# Patient Record
Sex: Female | Born: 1970 | Race: White | Hispanic: No | Smoking: Current some day smoker
Health system: Southern US, Community
[De-identification: ages and names within clinical notes are randomized; demographics above are authoritative.]

## PROBLEM LIST (undated history)

## (undated) DIAGNOSIS — G43909 Migraine, unspecified, not intractable, without status migrainosus: Secondary | ICD-10-CM

## (undated) DIAGNOSIS — N6459 Other signs and symptoms in breast: Secondary | ICD-10-CM

## (undated) DIAGNOSIS — I509 Heart failure, unspecified: Secondary | ICD-10-CM

## (undated) DIAGNOSIS — I2510 Atherosclerotic heart disease of native coronary artery without angina pectoris: Secondary | ICD-10-CM

## (undated) DIAGNOSIS — I10 Essential (primary) hypertension: Secondary | ICD-10-CM

## (undated) DIAGNOSIS — E119 Type 2 diabetes mellitus without complications: Secondary | ICD-10-CM

## (undated) DIAGNOSIS — J45909 Unspecified asthma, uncomplicated: Secondary | ICD-10-CM

## (undated) HISTORY — PX: ABDOMINAL HYSTERECTOMY: SHX81

---

## 2011-12-08 DIAGNOSIS — F339 Major depressive disorder, recurrent, unspecified: Secondary | ICD-10-CM | POA: Insufficient documentation

## 2011-12-08 DIAGNOSIS — F4310 Post-traumatic stress disorder, unspecified: Secondary | ICD-10-CM | POA: Insufficient documentation

## 2012-05-09 ENCOUNTER — Emergency Department: Payer: Self-pay | Admitting: Emergency Medicine

## 2012-05-09 LAB — CBC
HCT: 41.1 % (ref 35.0–47.0)
MCH: 32.1 pg (ref 26.0–34.0)
MCHC: 33.4 g/dL (ref 32.0–36.0)
Platelet: 198 10*3/uL (ref 150–440)
RDW: 12.5 % (ref 11.5–14.5)
WBC: 8.8 10*3/uL (ref 3.6–11.0)

## 2012-05-09 LAB — BASIC METABOLIC PANEL
Chloride: 112 mmol/L — ABNORMAL HIGH (ref 98–107)
Co2: 23 mmol/L (ref 21–32)
Creatinine: 0.62 mg/dL (ref 0.60–1.30)
EGFR (African American): >60
Potassium: 4.1 mmol/L (ref 3.5–5.1)

## 2012-05-09 LAB — TROPONIN I: Troponin-I: <0.02 ng/mL

## 2012-05-09 LAB — CK TOTAL AND CKMB (NOT AT ARMC): CK, Total: 106 U/L (ref 21–215)

## 2012-07-24 ENCOUNTER — Ambulatory Visit: Payer: Self-pay | Admitting: Family Medicine

## 2012-07-31 ENCOUNTER — Ambulatory Visit: Payer: Self-pay | Admitting: Family Medicine

## 2012-09-15 ENCOUNTER — Ambulatory Visit: Payer: Self-pay | Admitting: Family Medicine

## 2012-09-15 LAB — URINALYSIS, COMPLETE
Bilirubin,UR: NEGATIVE
Glucose,UR: NEGATIVE mg/dL (ref 0–75)
Leukocyte Esterase: NEGATIVE
Nitrite: NEGATIVE

## 2012-09-16 LAB — URINE CULTURE

## 2012-12-07 ENCOUNTER — Ambulatory Visit: Payer: Self-pay | Admitting: Emergency Medicine

## 2012-12-24 DIAGNOSIS — G479 Sleep disorder, unspecified: Secondary | ICD-10-CM | POA: Insufficient documentation

## 2012-12-24 DIAGNOSIS — R110 Nausea: Secondary | ICD-10-CM | POA: Insufficient documentation

## 2013-01-03 ENCOUNTER — Emergency Department: Payer: Self-pay | Admitting: Internal Medicine

## 2013-01-03 LAB — URINALYSIS, COMPLETE
Nitrite: NEGATIVE
Ph: 7 (ref 4.5–8.0)
Protein: NEGATIVE
RBC,UR: 1 /HPF (ref 0–5)
Specific Gravity: 1.006 (ref 1.003–1.030)
Squamous Epithelial: 4
WBC UR: 3 /HPF (ref 0–5)

## 2013-01-03 LAB — CBC
HGB: 12.9 g/dL (ref 12.0–16.0)
MCH: 31.8 pg (ref 26.0–34.0)
MCHC: 33.8 g/dL (ref 32.0–36.0)
Platelet: 219 10*3/uL (ref 150–440)
RBC: 4.06 10*6/uL (ref 3.80–5.20)
WBC: 8.5 10*3/uL (ref 3.6–11.0)

## 2013-01-03 LAB — TROPONIN I
Troponin-I: <0.02 ng/mL
Troponin-I: <0.02 ng/mL

## 2013-01-03 LAB — COMPREHENSIVE METABOLIC PANEL
Alkaline Phosphatase: 48 U/L — ABNORMAL LOW (ref 50–136)
Bilirubin,Total: 0.3 mg/dL (ref 0.2–1.0)
Calcium, Total: 8.4 mg/dL — ABNORMAL LOW (ref 8.5–10.1)
Creatinine: 0.6 mg/dL (ref 0.60–1.30)
EGFR (African American): >60
EGFR (Non-African Amer.): >60
Glucose: 89 mg/dL (ref 65–99)
Potassium: 4 mmol/L (ref 3.5–5.1)
SGPT (ALT): 32 U/L (ref 12–78)
Sodium: 141 mmol/L (ref 136–145)

## 2013-04-29 ENCOUNTER — Ambulatory Visit: Payer: Self-pay | Admitting: Physician Assistant

## 2013-05-05 LAB — RAPID INFLUENZA A&B ANTIGENS (ARMC ONLY)

## 2013-05-16 DIAGNOSIS — F17200 Nicotine dependence, unspecified, uncomplicated: Secondary | ICD-10-CM | POA: Insufficient documentation

## 2013-06-16 ENCOUNTER — Ambulatory Visit: Payer: Self-pay | Admitting: Internal Medicine

## 2013-10-26 ENCOUNTER — Emergency Department: Payer: Self-pay | Admitting: Emergency Medicine

## 2013-10-26 LAB — CBC WITH DIFFERENTIAL/PLATELET
BASOS ABS: 0 10*3/uL (ref 0.0–0.1)
Basophil %: 0.6 %
Eosinophil #: 0.3 10*3/uL (ref 0.0–0.7)
Eosinophil %: 4 %
HCT: 41.8 % (ref 35.0–47.0)
HGB: 13.9 g/dL (ref 12.0–16.0)
LYMPHS PCT: 34.7 %
Lymphocyte #: 2.5 10*3/uL (ref 1.0–3.6)
MCH: 32.3 pg (ref 26.0–34.0)
MCHC: 33.2 g/dL (ref 32.0–36.0)
MCV: 97 fL (ref 80–100)
MONO ABS: 0.3 x10 3/mm (ref 0.2–0.9)
Monocyte %: 4.6 %
Neutrophil #: 4.1 10*3/uL (ref 1.4–6.5)
Neutrophil %: 56.1 %
Platelet: 186 10*3/uL (ref 150–440)
RBC: 4.31 10*6/uL (ref 3.80–5.20)
RDW: 12.4 % (ref 11.5–14.5)
WBC: 7.2 10*3/uL (ref 3.6–11.0)

## 2013-10-26 LAB — COMPREHENSIVE METABOLIC PANEL
ANION GAP: 7 (ref 7–16)
Albumin: 4.1 g/dL (ref 3.4–5.0)
Alkaline Phosphatase: 42 U/L — ABNORMAL LOW
BUN: 18 mg/dL (ref 7–18)
Bilirubin,Total: 0.4 mg/dL (ref 0.2–1.0)
CHLORIDE: 108 mmol/L — AB (ref 98–107)
Calcium, Total: 9.1 mg/dL (ref 8.5–10.1)
Co2: 25 mmol/L (ref 21–32)
Creatinine: 0.89 mg/dL (ref 0.60–1.30)
EGFR (Non-African Amer.): >60
GLUCOSE: 100 mg/dL — AB (ref 65–99)
OSMOLALITY: 281 (ref 275–301)
POTASSIUM: 3.9 mmol/L (ref 3.5–5.1)
SGOT(AST): 15 U/L (ref 15–37)
SGPT (ALT): 18 U/L (ref 12–78)
Sodium: 140 mmol/L (ref 136–145)
Total Protein: 7.4 g/dL (ref 6.4–8.2)

## 2013-10-26 LAB — URINALYSIS, COMPLETE
BLOOD: NEGATIVE
GLUCOSE, UR: NEGATIVE mg/dL (ref 0–75)
Nitrite: NEGATIVE
PH: 5 (ref 4.5–8.0)
SPECIFIC GRAVITY: 1.043 (ref 1.003–1.030)

## 2013-12-30 ENCOUNTER — Emergency Department: Payer: Self-pay | Admitting: Emergency Medicine

## 2013-12-30 LAB — BASIC METABOLIC PANEL
Anion Gap: 4 — ABNORMAL LOW (ref 7–16)
BUN: 17 mg/dL (ref 7–18)
CO2: 28 mmol/L (ref 21–32)
CREATININE: 0.75 mg/dL (ref 0.60–1.30)
Calcium, Total: 8.7 mg/dL (ref 8.5–10.1)
Chloride: 109 mmol/L — ABNORMAL HIGH (ref 98–107)
EGFR (African American): >60
GLUCOSE: 123 mg/dL — AB (ref 65–99)
OSMOLALITY: 284 (ref 275–301)
Potassium: 3.7 mmol/L (ref 3.5–5.1)
Sodium: 141 mmol/L (ref 136–145)

## 2013-12-30 LAB — CBC
HCT: 41.4 % (ref 35.0–47.0)
HGB: 13.9 g/dL (ref 12.0–16.0)
MCH: 32.5 pg (ref 26.0–34.0)
MCHC: 33.5 g/dL (ref 32.0–36.0)
MCV: 97 fL (ref 80–100)
Platelet: 157 10*3/uL (ref 150–440)
RBC: 4.26 10*6/uL (ref 3.80–5.20)
RDW: 12.9 % (ref 11.5–14.5)
WBC: 6.4 10*3/uL (ref 3.6–11.0)

## 2013-12-30 LAB — TROPONIN I: Troponin-I: <0.02 ng/mL

## 2014-01-16 ENCOUNTER — Emergency Department: Payer: Self-pay | Admitting: Emergency Medicine

## 2014-01-16 LAB — DRUG SCREEN, URINE
Amphetamines, Ur Screen: NEGATIVE (ref ?–1000)
BARBITURATES, UR SCREEN: NEGATIVE (ref ?–200)
BENZODIAZEPINE, UR SCRN: NEGATIVE (ref ?–200)
CANNABINOID 50 NG, UR ~~LOC~~: NEGATIVE (ref ?–50)
COCAINE METABOLITE, UR ~~LOC~~: NEGATIVE (ref ?–300)
MDMA (Ecstasy)Ur Screen: NEGATIVE (ref ?–500)
Methadone, Ur Screen: NEGATIVE (ref ?–300)
Opiate, Ur Screen: NEGATIVE (ref ?–300)
Phencyclidine (PCP) Ur S: NEGATIVE (ref ?–25)
TRICYCLIC, UR SCREEN: NEGATIVE (ref ?–1000)

## 2014-01-16 LAB — CBC
HCT: 42.5 % (ref 35.0–47.0)
HGB: 13.7 g/dL (ref 12.0–16.0)
MCH: 31.8 pg (ref 26.0–34.0)
MCHC: 32.3 g/dL (ref 32.0–36.0)
MCV: 98 fL (ref 80–100)
Platelet: 187 10*3/uL (ref 150–440)
RBC: 4.32 10*6/uL (ref 3.80–5.20)
RDW: 13.2 % (ref 11.5–14.5)
WBC: 8.8 10*3/uL (ref 3.6–11.0)

## 2014-01-16 LAB — URINALYSIS, COMPLETE
BACTERIA: NONE SEEN
BLOOD: NEGATIVE
Bilirubin,UR: NEGATIVE
Glucose,UR: NEGATIVE mg/dL (ref 0–75)
KETONE: NEGATIVE
Leukocyte Esterase: NEGATIVE
NITRITE: NEGATIVE
PROTEIN: NEGATIVE
Ph: 5 (ref 4.5–8.0)
SPECIFIC GRAVITY: 1.028 (ref 1.003–1.030)
WBC UR: <1 /HPF (ref 0–5)

## 2014-01-16 LAB — ACETAMINOPHEN LEVEL

## 2014-01-16 LAB — COMPREHENSIVE METABOLIC PANEL
ALBUMIN: 3.8 g/dL (ref 3.4–5.0)
ALK PHOS: 39 U/L — AB
ALT: 29 U/L
AST: 24 U/L (ref 15–37)
Anion Gap: 8 (ref 7–16)
BUN: 15 mg/dL (ref 7–18)
Bilirubin,Total: 0.4 mg/dL (ref 0.2–1.0)
CHLORIDE: 108 mmol/L — AB (ref 98–107)
CO2: 23 mmol/L (ref 21–32)
Calcium, Total: 8.4 mg/dL — ABNORMAL LOW (ref 8.5–10.1)
Creatinine: 0.6 mg/dL (ref 0.60–1.30)
EGFR (Non-African Amer.): >60
GLUCOSE: 103 mg/dL — AB (ref 65–99)
Osmolality: 279 (ref 275–301)
POTASSIUM: 3.6 mmol/L (ref 3.5–5.1)
Sodium: 139 mmol/L (ref 136–145)
TOTAL PROTEIN: 7.4 g/dL (ref 6.4–8.2)

## 2014-01-16 LAB — ETHANOL

## 2014-01-16 LAB — SALICYLATE LEVEL: Salicylates, Serum: 3.3 mg/dL — ABNORMAL HIGH

## 2014-01-17 ENCOUNTER — Emergency Department: Payer: Self-pay | Admitting: Emergency Medicine

## 2014-02-10 ENCOUNTER — Ambulatory Visit: Payer: Self-pay | Admitting: Internal Medicine

## 2014-02-23 ENCOUNTER — Ambulatory Visit: Payer: Self-pay | Admitting: Internal Medicine

## 2014-03-22 ENCOUNTER — Emergency Department: Payer: Self-pay | Admitting: Emergency Medicine

## 2014-03-22 LAB — BASIC METABOLIC PANEL
Anion Gap: 6 — ABNORMAL LOW (ref 7–16)
BUN: 22 mg/dL — ABNORMAL HIGH (ref 7–18)
Calcium, Total: 8.5 mg/dL (ref 8.5–10.1)
Chloride: 107 mmol/L (ref 98–107)
Co2: 24 mmol/L (ref 21–32)
Creatinine: 0.7 mg/dL (ref 0.60–1.30)
EGFR (African American): >60
EGFR (Non-African Amer.): >60
Glucose: 102 mg/dL — ABNORMAL HIGH (ref 65–99)
Osmolality: 277 (ref 275–301)
Potassium: 3.9 mmol/L (ref 3.5–5.1)
Sodium: 137 mmol/L (ref 136–145)

## 2014-03-22 LAB — CBC
HCT: 40.9 % (ref 35.0–47.0)
HGB: 13.6 g/dL (ref 12.0–16.0)
MCH: 32.9 pg (ref 26.0–34.0)
MCHC: 33.2 g/dL (ref 32.0–36.0)
MCV: 99 fL (ref 80–100)
Platelet: 182 10*3/uL (ref 150–440)
RBC: 4.13 10*6/uL (ref 3.80–5.20)
RDW: 13 % (ref 11.5–14.5)
WBC: 8.9 10*3/uL (ref 3.6–11.0)

## 2014-03-22 LAB — TROPONIN I
Troponin-I: <0.02 ng/mL
Troponin-I: <0.02 ng/mL

## 2014-03-25 ENCOUNTER — Ambulatory Visit: Payer: Self-pay | Admitting: Internal Medicine

## 2014-06-18 ENCOUNTER — Emergency Department: Payer: Self-pay | Admitting: Emergency Medicine

## 2014-07-09 ENCOUNTER — Emergency Department: Payer: Self-pay | Admitting: Internal Medicine

## 2014-08-03 ENCOUNTER — Emergency Department
Admit: 2014-08-03 | Disposition: A | Discharge status: Home / Self Care | Payer: Self-pay | Admitting: Physician Assistant

## 2014-10-12 ENCOUNTER — Encounter: Payer: Self-pay | Admitting: Emergency Medicine

## 2014-10-12 ENCOUNTER — Other Ambulatory Visit: Payer: Self-pay

## 2014-10-12 ENCOUNTER — Emergency Department
Admission: EM | Admit: 2014-10-12 | Discharge: 2014-10-12 | Disposition: A | Discharge status: Home / Self Care | Payer: Medicaid Other | Attending: Emergency Medicine | Admitting: Emergency Medicine

## 2014-10-12 DIAGNOSIS — F430 Acute stress reaction: Secondary | ICD-10-CM | POA: Diagnosis not present

## 2014-10-12 DIAGNOSIS — Z720 Tobacco use: Secondary | ICD-10-CM | POA: Diagnosis not present

## 2014-10-12 DIAGNOSIS — R079 Chest pain, unspecified: Secondary | ICD-10-CM | POA: Insufficient documentation

## 2014-10-12 DIAGNOSIS — R110 Nausea: Secondary | ICD-10-CM

## 2014-10-12 DIAGNOSIS — F419 Anxiety disorder, unspecified: Secondary | ICD-10-CM | POA: Insufficient documentation

## 2014-10-12 DIAGNOSIS — R519 Headache, unspecified: Secondary | ICD-10-CM

## 2014-10-12 DIAGNOSIS — R51 Headache: Secondary | ICD-10-CM | POA: Diagnosis present

## 2014-10-12 HISTORY — DX: Unspecified asthma, uncomplicated: J45.909

## 2014-10-12 HISTORY — DX: Heart failure, unspecified: I50.9

## 2014-10-12 HISTORY — DX: Migraine, unspecified, not intractable, without status migrainosus: G43.909

## 2014-10-12 LAB — COMPREHENSIVE METABOLIC PANEL
ALT: 22 U/L (ref 14–54)
ANION GAP: 4 — AB (ref 5–15)
AST: 18 U/L (ref 15–41)
Albumin: 3.8 g/dL (ref 3.5–5.0)
Alkaline Phosphatase: 42 U/L (ref 38–126)
BUN: 15 mg/dL (ref 6–20)
CALCIUM: 8.5 mg/dL — AB (ref 8.9–10.3)
CO2: 24 mmol/L (ref 22–32)
CREATININE: 0.59 mg/dL (ref 0.44–1.00)
Chloride: 112 mmol/L — ABNORMAL HIGH (ref 101–111)
GFR calc non Af Amer: >60 mL/min (ref >60)
GLUCOSE: 109 mg/dL — AB (ref 65–99)
POTASSIUM: 3.8 mmol/L (ref 3.5–5.1)
Sodium: 140 mmol/L (ref 135–145)
Total Bilirubin: 0.6 mg/dL (ref 0.3–1.2)
Total Protein: 6.7 g/dL (ref 6.5–8.1)

## 2014-10-12 LAB — LIPASE, BLOOD: LIPASE: 27 U/L (ref 22–51)

## 2014-10-12 LAB — CBC WITH DIFFERENTIAL/PLATELET
BASOS ABS: 0 10*3/uL (ref 0–0.1)
Basophils Relative: 1 %
Eosinophils Absolute: 0.3 10*3/uL (ref 0–0.7)
Eosinophils Relative: 4 %
HEMATOCRIT: 41.1 % (ref 35.0–47.0)
Hemoglobin: 13.9 g/dL (ref 12.0–16.0)
LYMPHS ABS: 2.2 10*3/uL (ref 1.0–3.6)
Lymphocytes Relative: 28 %
MCH: 32.7 pg (ref 26.0–34.0)
MCHC: 33.9 g/dL (ref 32.0–36.0)
MCV: 96.5 fL (ref 80.0–100.0)
MONO ABS: 0.4 10*3/uL (ref 0.2–0.9)
Monocytes Relative: 5 %
Neutro Abs: 4.9 10*3/uL (ref 1.4–6.5)
Neutrophils Relative %: 62 %
PLATELETS: 169 10*3/uL (ref 150–440)
RBC: 4.26 MIL/uL (ref 3.80–5.20)
RDW: 12.7 % (ref 11.5–14.5)
WBC: 7.8 10*3/uL (ref 3.6–11.0)

## 2014-10-12 LAB — TROPONIN I: Troponin I: <0.03 ng/mL (ref <0.031)

## 2014-10-12 MED ORDER — ONDANSETRON HCL 4 MG/2ML IJ SOLN
INTRAMUSCULAR | Status: AC
  Filled 2014-10-12: qty 2

## 2014-10-12 MED ORDER — METOCLOPRAMIDE HCL 5 MG/ML IJ SOLN
10.0000 mg | Freq: Once | INTRAMUSCULAR | Status: AC
  Administered 2014-10-12: 10 mg via INTRAVENOUS

## 2014-10-12 MED ORDER — KETOROLAC TROMETHAMINE 30 MG/ML IJ SOLN
INTRAMUSCULAR | Status: AC
  Filled 2014-10-12: qty 1

## 2014-10-12 MED ORDER — KETOROLAC TROMETHAMINE 30 MG/ML IJ SOLN
30.0000 mg | Freq: Once | INTRAMUSCULAR | Status: AC
  Administered 2014-10-12: 30 mg via INTRAVENOUS

## 2014-10-12 MED ORDER — METOCLOPRAMIDE HCL 5 MG/ML IJ SOLN
INTRAMUSCULAR | Status: AC
  Filled 2014-10-12: qty 2

## 2014-10-12 MED ORDER — ONDANSETRON HCL 4 MG/2ML IJ SOLN
4.0000 mg | Freq: Once | INTRAMUSCULAR | Status: AC
  Administered 2014-10-12: 4 mg via INTRAVENOUS

## 2014-10-12 NOTE — ED Notes (Signed)
Patient to ED with c.o nausea and vomiting that started this AM. Patient vomiting upon arrival to ER. Patient states she is also experiencing chest pain that started last week. Patient states a headache has also been present during the past week.

## 2014-10-12 NOTE — ED Provider Notes (Signed)
[facility] Emergency Department Provider Note     Time seen: ----------------------------------------- 3:26 PM on 10/12/2014 -----------------------------------------    I have reviewed the triage vital signs and the nursing notes.   HISTORY  Chief Complaint Nausea and Emesis    HPI JOSHUA ZERINGUE is a 44 y.o. female who presents ER for nausea, headache, chest pain. Patient states people are breaking into her house and the landlord knows this. His been causing her stress and her making her symptoms worse. This been going on for the last week or so, she seen by Dr. Lennette Bihari cardiology for her heart failure. Denies any fluid retention or shortness of breath.   Past Medical History  Diagnosis Date  . CHF (congestive heart failure)   . Asthma   . Migraines     There are no active problems to display for this patient.   Past Surgical History  Procedure Laterality Date  . Abdominal hysterectomy      Allergies Latex and Penicillins  Social History History  Substance Use Topics  . Smoking status: Current Every Day Smoker  . Smokeless tobacco: Not on file  . Alcohol Use: No    Review of Systems Constitutional: Negative for fever. Eyes: Negative for visual changes. ENT: Negative for sore throat. Cardiovascular: Positive for chest pain Respiratory: Negative for shortness of breath. Gastrointestinal: Negative for abdominal pain, vomiting and diarrhea. Genitourinary: Negative for dysuria. Musculoskeletal: Negative for back pain. Skin: Negative for rash. Neurological: Positive for headache, focal weakness or numbness. Psychiatric: Positive for anxiety and stress  10-point ROS otherwise negative.  ____________________________________________   PHYSICAL EXAM:  VITAL SIGNS: ED Triage Vitals  Enc Vitals Group     BP 10/12/14 1312 125/92 mmHg     Pulse Rate 10/12/14 1312 62     Resp 10/12/14 1312 18     Temp 10/12/14 1312 98.7 F (37.1  C)     Temp Source 10/12/14 1312 Oral     SpO2 10/12/14 1312 94 %     Weight 10/12/14 1312 224 lb (101.606 kg)     Height 10/12/14 1312  (1.753 m)     Head Cir --      Peak Flow --      Pain Score 10/12/14 1313 10     Pain Loc --      Pain Edu? --      Excl. in GC? --     Constitutional: Alert and oriented. Well appearing and in no distress. Eyes: Conjunctivae are normal. PERRL. Normal extraocular movements. ENT   Head: Normocephalic and atraumatic.   Nose: No congestion/rhinnorhea.   Mouth/Throat: Mucous membranes are moist.   Neck: No stridor. Hematological/Lymphatic/Immunilogical: No cervical lymphadenopathy. Cardiovascular: Normal rate, regular rhythm. Normal and symmetric distal pulses are present in all extremities. No murmurs, rubs, or gallops. Respiratory: Normal respiratory effort without tachypnea nor retractions. Breath sounds are clear and equal bilaterally. No wheezes/rales/rhonchi. Gastrointestinal: Soft and nontender. No distention. No abdominal bruits. There is no CVA tenderness. Musculoskeletal: Nontender with normal range of motion in all extremities. No joint effusions.  No lower extremity tenderness nor edema. Neurologic:  Normal speech and language. No gross focal neurologic deficits are appreciated. Speech is normal. No gait instability. Skin:  Skin is warm, dry and intact. No rash noted. Psychiatric: Bizarre behavior and affect. ____________________________________________  EKG: Interpreted by me. Sinus bradycardia with a rate of 58, RSR pattern, normal axis, normal intervals. No evidence of hypertrophy or acute infarction.  ____________________________________________  ED COURSE:  Pertinent labs & imaging results that were available during my care of the patient were reviewed by me and considered in my medical decision making (see chart for details). Patient receive headache and nausea medications, this is likely psychosocial in  etiology. ____________________________________________    LABS (pertinent positives/negatives)  Labs Reviewed  COMPREHENSIVE METABOLIC PANEL - Abnormal; Notable for the following:    Chloride 112 (*)    Glucose, Bld 109 (*)    Calcium 8.5 (*)    Anion gap 4 (*)    All other components within normal limits  CBC WITH DIFFERENTIAL/PLATELET  LIPASE, BLOOD  TROPONIN I    RADIOLOGY  None  ____________________________________________  FINAL ASSESSMENT AND PLAN  Headache, nausea, chest pain     Plan: Patient is in no acute distress, this seems to be related to the stress that she is under currently. We'll discharge with close follow-up with her doctor. She tolerating by mouth well now.   Emily Filbert, MD   Emily Filbert, MD 10/12/14 [phone]

## 2014-10-12 NOTE — ED Notes (Signed)
Patient given sandwich tray and ice water.

## 2014-10-12 NOTE — Discharge Instructions (Signed)

## 2014-11-02 ENCOUNTER — Ambulatory Visit
Admission: EM | Admit: 2014-11-02 | Discharge: 2014-11-02 | Disposition: A | Discharge status: Home / Self Care | Payer: Medicaid Other | Attending: Family Medicine | Admitting: Family Medicine

## 2014-11-02 DIAGNOSIS — H109 Unspecified conjunctivitis: Secondary | ICD-10-CM

## 2014-11-02 MED ORDER — DIPHENHYDRAMINE HCL 12.5 MG/5ML PO ELIX
25.0000 mg | ORAL_SOLUTION | Freq: Every day | ORAL | Status: AC | PRN
  Administered 2014-11-02: 25 mg via ORAL

## 2014-11-02 MED ORDER — MOXIFLOXACIN HCL 0.5 % OP SOLN: 1.0000 [drp] | Freq: Three times a day (TID) | OPHTHALMIC | Status: DC

## 2014-11-02 NOTE — ED Notes (Signed)
Patient complains of right eye pain. States that pain started last night. Patient eye is crusted over and swollen over. She states that she cannot see out of eye at all anymore. Patient states that she cannot open her eye. She states that she believes that this is an allergic reaction to the cats in her neighborhood.

## 2014-11-02 NOTE — ED Provider Notes (Signed)
CSN: [id number]     Arrival date & time 11/02/14  1125 History   First MD Initiated Contact with Patient 11/02/14 1140     Chief Complaint  Patient presents with  . Eye Pain   (Consider location/radiation/quality/duration/timing/severity/associated sxs/prior Treatment) HPI Comments: 44 yo female with a h/o right eye drainage, pain and crusting of the eyelids since last night. Did not rinse the eye or apply anything wet compresses. Eyelids currently crusted shut with drainage. Denies any injuries or trauma. States she thinks this is an allergic reaction to the cats in her neighborhood. Denies any difficulty breathing or swallowing, or facial swelling.  Patient is a 44 y.o. female presenting with conjunctivitis. The history is provided by the patient.  Conjunctivitis This is a new problem. The current episode started 12 to 24 hours ago. The problem occurs constantly. The problem has not changed since onset.   Past Medical History  Diagnosis Date  . CHF (congestive heart failure)   . Asthma   . Migraines    Past Surgical History  Procedure Laterality Date  . Abdominal hysterectomy     No family history on file. History  Substance Use Topics  . Smoking status: Current Every Day Smoker -- 0.50 packs/day    Types: Cigarettes  . Smokeless tobacco: Not on file  . Alcohol Use: No   OB History    No data available     Review of Systems  Allergies  Latex and Penicillins  Home Medications   Prior to Admission medications   Medication Sig Start Date End Date Taking? Authorizing Provider  moxifloxacin (VIGAMOX) 0.5 % ophthalmic solution Place 1 drop into the right eye 3 (three) times daily. For 5 days 11/02/14   Payton Mccallum, MD   BP 116/73 mmHg  Pulse 57  Temp(Src) 97.9 F (36.6 C) (Oral)  Resp 16  Ht  (1.753 m)  SpO2 99% Physical Exam  Constitutional: She appears well-developed and well-nourished. No distress.  HENT:  Head: Normocephalic and atraumatic.  Right Ear:  External ear normal.  Left Ear: External ear normal.  Nose: Nose normal.  Mouth/Throat: Oropharynx is clear and moist.  Eyes: EOM are normal. Pupils are equal, round, and reactive to light. Right eye exhibits discharge and exudate. Left eye exhibits no discharge. Right conjunctiva is injected. Left conjunctiva is not injected. No scleral icterus.  Right eyelids crusted shut with yellow drainage; this was cleaned/removed with moist gauze and eyelids opened; no foreign bodies visualized; conjunctiva injected; PERLL; EOMI  Neck: Neck supple. No tracheal deviation present.  Cardiovascular: Normal rate, regular rhythm and normal heart sounds.   Pulmonary/Chest: Effort normal and breath sounds normal. No respiratory distress. She has no wheezes. She has no rales.  Lymphadenopathy:    She has no cervical adenopathy.  Neurological: She is alert.  Skin: No rash noted. She is not diaphoretic.  Nursing note and vitals reviewed.   ED Course  Procedures (including critical care time) Labs Review Labs Reviewed - No data to display  Imaging Review No results found.   MDM   1. Conjunctivitis of right eye    New Prescriptions   MOXIFLOXACIN (VIGAMOX) 0.5 % OPHTHALMIC SOLUTION    Place 1 drop into the right eye 3 (three) times daily. For 5 days  Plan: 1. Test/x-ray results and diagnosis reviewed with patient 2. rx as per orders; risks, benefits, potential side effects reviewed with patient 3. Recommend supportive treatment with cool, moist compresses to affected eye 4. F/u prn  if symptoms worsen or don't improve    Payton Mccallumrlando Xzavian Semmel, MD 11/02/14 1229

## 2015-02-03 ENCOUNTER — Encounter: Payer: Self-pay | Admitting: Emergency Medicine

## 2015-02-03 ENCOUNTER — Emergency Department
Admission: EM | Admit: 2015-02-03 | Discharge: 2015-02-03 | Disposition: A | Discharge status: Home / Self Care | Payer: Medicaid Other | Attending: Emergency Medicine | Admitting: Emergency Medicine

## 2015-02-03 ENCOUNTER — Emergency Department: Payer: Medicaid Other

## 2015-02-03 DIAGNOSIS — M7750 Other enthesopathy of unspecified foot: Secondary | ICD-10-CM

## 2015-02-03 DIAGNOSIS — E119 Type 2 diabetes mellitus without complications: Secondary | ICD-10-CM | POA: Diagnosis not present

## 2015-02-03 DIAGNOSIS — M79671 Pain in right foot: Secondary | ICD-10-CM

## 2015-02-03 DIAGNOSIS — Z720 Tobacco use: Secondary | ICD-10-CM | POA: Insufficient documentation

## 2015-02-03 DIAGNOSIS — Z880 Allergy status to penicillin: Secondary | ICD-10-CM | POA: Insufficient documentation

## 2015-02-03 DIAGNOSIS — R260 Ataxic gait: Secondary | ICD-10-CM | POA: Diagnosis not present

## 2015-02-03 DIAGNOSIS — M779 Enthesopathy, unspecified: Secondary | ICD-10-CM | POA: Insufficient documentation

## 2015-02-03 DIAGNOSIS — Z91040 Latex allergy status: Secondary | ICD-10-CM | POA: Insufficient documentation

## 2015-02-03 DIAGNOSIS — I10 Essential (primary) hypertension: Secondary | ICD-10-CM | POA: Insufficient documentation

## 2015-02-03 DIAGNOSIS — Z792 Long term (current) use of antibiotics: Secondary | ICD-10-CM | POA: Diagnosis not present

## 2015-02-03 HISTORY — DX: Essential (primary) hypertension: I10

## 2015-02-03 HISTORY — DX: Type 2 diabetes mellitus without complications: E11.9

## 2015-02-03 MED ORDER — TRAMADOL HCL 50 MG PO TABS
ORAL_TABLET | ORAL | Status: DC
Start: 2015-02-03 — End: 2015-02-03
  Filled 2015-02-03: qty 1

## 2015-02-03 MED ORDER — TRAMADOL HCL 50 MG PO TABS: 50.0000 mg | ORAL_TABLET | Freq: Four times a day (QID) | ORAL | Status: DC | PRN

## 2015-02-03 MED ORDER — TRAMADOL HCL 50 MG PO TABS
50.0000 mg | ORAL_TABLET | Freq: Once | ORAL | Status: AC
  Administered 2015-02-03: 50 mg via ORAL

## 2015-02-03 NOTE — Discharge Instructions (Signed)
Cryotherapy °Cryotherapy means treatment with cold. Ice or gel packs can be used to reduce both pain and swelling. Ice is the most helpful within the first 24 to 48 hours after an injury or flare-up from overusing a muscle or joint. Sprains, strains, spasms, burning pain, shooting pain, and aches can all be eased with ice. Ice can also be used when recovering from surgery. Ice is effective, has very few side effects, and is safe for most people to use. °PRECAUTIONS  °Ice is not a safe treatment option for people with: °· Raynaud phenomenon. This is a condition affecting small blood vessels in the extremities. Exposure to cold may cause your problems to return. °· Cold hypersensitivity. There are many forms of cold hypersensitivity, including: °¨ Cold urticaria. Red, itchy hives appear on the skin when the tissues begin to warm after being iced. °¨ Cold erythema. This is a red, itchy rash caused by exposure to cold. °¨ Cold hemoglobinuria. Red blood cells break down when the tissues begin to warm after being iced. The hemoglobin that carry oxygen are passed into the urine because they cannot combine with blood proteins fast enough. °· Numbness or altered sensitivity in the area being iced. °If you have any of the following conditions, do not use ice until you have discussed cryotherapy with your caregiver: °· Heart conditions, such as arrhythmia, angina, or chronic heart disease. °· High blood pressure. °· Healing wounds or open skin in the area being iced. °· Current infections. °· Rheumatoid arthritis. °· Poor circulation. °· Diabetes. °Ice slows the blood flow in the region it is applied. This is beneficial when trying to stop inflamed tissues from spreading irritating chemicals to surrounding tissues. However, if you expose your skin to cold temperatures for too long or without the proper protection, you can damage your skin or nerves. Watch for signs of skin damage due to cold. °HOME CARE INSTRUCTIONS °Follow  these tips to use ice and cold packs safely. °· Place a dry or damp towel between the ice and skin. A damp towel will cool the skin more quickly, so you may need to shorten the time that the ice is used. °· For a more rapid response, add gentle compression to the ice. °· Ice for no more than 10 to 20 minutes at a time. The bonier the area you are icing, the less time it will take to get the benefits of ice. °· Check your skin after 5 minutes to make sure there are no signs of a poor response to cold or skin damage. °· Rest 20 minutes or more between uses. °· Once your skin is numb, you can end your treatment. You can test numbness by very lightly touching your skin. The touch should be so light that you do not see the skin dimple from the pressure of your fingertip. When using ice, most people will feel these normal sensations in this order: cold, burning, aching, and numbness. °· Do not use ice on someone who cannot communicate their responses to pain, such as small children or people with dementia. °HOW TO MAKE AN ICE PACK °Ice packs are the most common way to use ice therapy. Other methods include ice massage, ice baths, and cryosprays. Muscle creams that cause a cold, tingly feeling do not offer the same benefits that ice offers and should not be used as a substitute unless recommended by your caregiver. °To make an ice pack, do one of the following: °· Place crushed ice or a   bag of frozen vegetables in a sealable plastic bag. Squeeze out the excess air. Place this bag inside another plastic bag. Slide the bag into a pillowcase or place a damp towel between your skin and the bag. °· Mix 3 parts water with 1 part rubbing alcohol. Freeze the mixture in a sealable plastic bag. When you remove the mixture from the freezer, it will be slushy. Squeeze out the excess air. Place this bag inside another plastic bag. Slide the bag into a pillowcase or place a damp towel between your skin and the bag. °SEEK MEDICAL CARE  IF: °· You develop white spots on your skin. This may give the skin a blotchy (mottled) appearance. °· Your skin turns blue or pale. °· Your skin becomes waxy or hard. °· Your swelling gets worse. °MAKE SURE YOU:  °· Understand these instructions. °· Will watch your condition. °· Will get help right away if you are not doing well or get worse. °  °This information is not intended to replace advice given to you by your health care provider. Make sure you discuss any questions you have with your health care provider. °  °Document Released: 12/06/2010 Document Revised: 05/02/2014 Document Reviewed: 12/06/2010 °Elsevier Interactive Patient Education ©2016 [employer] ° °Musculoskeletal Pain °Musculoskeletal pain is muscle and boney aches and pains. These pains can occur in any part of the body. Your caregiver may treat you without knowing the cause of the pain. They may treat you if blood or urine tests, X-rays, and other tests were normal.  °CAUSES °There is often not a definite cause or reason for these pains. These pains may be caused by a type of germ (virus). The discomfort may also come from overuse. Overuse includes working out too hard when your body is not fit. Boney aches also come from weather changes. Bone is sensitive to atmospheric pressure changes. °HOME CARE INSTRUCTIONS  °· Ask when your test results will be ready. Make sure you get your test results. °· Only take over-the-counter or prescription medicines for pain, discomfort, or fever as directed by your caregiver. If you were given medications for your condition, do not drive, operate machinery or power tools, or sign legal documents for 24 hours. Do not drink alcohol. Do not take sleeping pills or other medications that may interfere with treatment. °· Continue all activities unless the activities cause more pain. When the pain lessens, slowly resume normal activities. Gradually increase the intensity and duration of the activities or  exercise. °· During periods of severe pain, bed rest may be helpful. Lay or sit in any position that is comfortable. °· Putting ice on the injured area. °¨ Put ice in a bag. °¨ Place a towel between your skin and the bag. °¨ Leave the ice on for 15 to 20 minutes, 3 to 4 times a day. °· Follow up with your caregiver for continued problems and no reason can be found for the pain. If the pain becomes worse or does not go away, it may be necessary to repeat tests or do additional testing. Your caregiver may need to look further for a possible cause. °SEEK IMMEDIATE MEDICAL CARE IF: °· You have pain that is getting worse and is not relieved by medications. °· You develop chest pain that is associated with shortness or breath, sweating, feeling sick to your stomach (nauseous), or throw up (vomit). °· Your pain becomes localized to the abdomen. °· You develop any new symptoms that seem different or that concern   you. °MAKE SURE YOU:  °· Understand these instructions. °· Will watch your condition. °· Will get help right away if you are not doing well or get worse. °  °This information is not intended to replace advice given to you by your health care provider. Make sure you discuss any questions you have with your health care provider. °  °Document Released: 04/11/2005 Document Revised: 07/04/2011 Document Reviewed: 12/14/2012 °Elsevier Interactive Patient Education ©2016 [employer] ° °

## 2015-02-03 NOTE — ED Provider Notes (Signed)
CSN: [id number]     Arrival date & time 02/03/15  1610 History   First MD Initiated Contact with Patient 02/03/15 1628     Chief Complaint  Patient presents with  . Foot Pain     (Consider location/radiation/quality/duration/timing/severity/associated sxs/prior Treatment) HPI  44 year old female presents to emergency department for evaluation of right foot pain. Pain began one day ago. She points to the dorsum of the foot. She describes a 8 out of 10 and sharp stabbing pain that is increased with weightbearing activity. She gets relief with rest. She has a history of diabetes but denies any diagnosis of peripheral neuropathy. She is currently on gabapentin 1 tab daily, ibuprofen. She has had no relief with the ibuprofen. She states she has had recent trauma over the last few months, she fell through the floor with her right foot, has not had x-rays, was able to ambulate after this incident. Today's pain is different, has only been present for 1 day. Patient denies any ankle or knee pain. There has been no warmth or redness.  Past Medical History  Diagnosis Date  . CHF (congestive heart failure) (HCC)   . Asthma   . Migraines   . Diabetes mellitus without complication (HCC)   . Hypertension    Past Surgical History  Procedure Laterality Date  . Abdominal hysterectomy     No family history on file. Social History  Substance Use Topics  . Smoking status: Current Every Day Smoker -- 0.50 packs/day    Types: Cigarettes  . Smokeless tobacco: None  . Alcohol Use: No   OB History    No data available     Review of Systems  Constitutional: Negative for fever, chills, activity change and fatigue.  HENT: Negative for congestion, sinus pressure and sore throat.   Eyes: Negative for visual disturbance.  Respiratory: Negative for cough, chest tightness and shortness of breath.   Cardiovascular: Negative for chest pain and leg swelling.  Gastrointestinal: Negative for nausea, vomiting,  abdominal pain and diarrhea.  Genitourinary: Negative for dysuria.  Musculoskeletal: Positive for arthralgias and gait problem.  Skin: Negative for rash.  Neurological: Negative for weakness, numbness and headaches.  Hematological: Negative for adenopathy.  Psychiatric/Behavioral: Negative for behavioral problems, confusion and agitation.      Allergies  Latex and Penicillins  Home Medications   Prior to Admission medications   Medication Sig Start Date End Date Taking? Authorizing Provider  moxifloxacin (VIGAMOX) 0.5 % ophthalmic solution Place 1 drop into the right eye 3 (three) times daily. For 5 days 11/02/14   Payton Mccallum, MD  traMADol (ULTRAM) 50 MG tablet Take 1 tablet (50 mg total) by mouth every 6 (six) hours as needed. 02/03/15   Evon Slack, PA-C   BP 122/77 mmHg  Pulse 96  Temp(Src) 98.2 F (36.8 C) (Oral)  Resp 18  Ht  (1.753 m)  Wt 246 lb (111.585 kg)  BMI 36.31 kg/m2  SpO2 97% Physical Exam  Constitutional: She is oriented to person, place, and time. She appears well-developed and well-nourished. No distress.  HENT:  Head: Normocephalic and atraumatic.  Mouth/Throat: Oropharynx is clear and moist.  Eyes: EOM are normal. Pupils are equal, round, and reactive to light. Right eye exhibits no discharge. Left eye exhibits no discharge.  Neck: Normal range of motion. Neck supple.  Cardiovascular: Normal rate and intact distal pulses.   Pulmonary/Chest: Effort normal. No respiratory distress.  Musculoskeletal:  Examination of the right lower extremity shows patient has  no edema swelling warmth or erythema. She is tender to palpation on the dorsum of the foot with light palpation. She is nontender throughout the ankle. Has good ankle plantarflexion and dorsiflexion. No increased pain with ankle range of motion. She has 2+ posterior tibialis and dorsalis pedis pulse. 2+ Refill. No skin breakdown or ulcerations noted.  Neurological: She is alert and oriented to  person, place, and time. She has normal reflexes.  Skin: Skin is warm and dry.  Psychiatric: She has a normal mood and affect. Her behavior is normal. Thought content normal.    ED Course  Procedures (including critical care time) Labs Review Labs Reviewed - No data to display  Imaging Review Dg Foot Complete Right  02/03/2015   CLINICAL DATA:  Right foot pain  EXAM: RIGHT FOOT COMPLETE - 3+ VIEW  COMPARISON:  None.  FINDINGS: Three views of the right foot submitted. No acute fracture or subluxation. Small plantar spur of calcaneus. Dorsal spurring tarsal region.  IMPRESSION: No acute fracture or subluxation. Plantar spur of calcaneus. Mild spurring dorsal tarsal region.   Electronically Signed   By: Natasha Mead M.D.   On: 02/03/2015 16:53   I have personally reviewed and evaluated these images and lab results as part of my medical decision-making.   EKG Interpretation None      MDM   Final diagnoses:  Right foot pain  Bone spur of foot    44 year old female with right foot pain over the dorsum. X-rays show dorsal tarsal spurring. Patient will continue with ibuprofen, she was given tramadol for breakthrough pain. She will rest ice elevate and avoid shoes that put pressure on the dorsum of the foot. She'll follow-up with orthopedics/podiatry if no improvement in 5-7 days.    Evon Slack, PA-C 02/03/15 1709  Loleta Rose, MD 02/03/15 [phone]

## 2015-02-03 NOTE — ED Notes (Signed)
C/o right foot pain since yesterday am, unknown if any injury

## 2015-03-18 ENCOUNTER — Emergency Department
Admission: EM | Admit: 2015-03-18 | Discharge: 2015-03-18 | Disposition: A | Discharge status: Home / Self Care | Payer: Medicaid Other | Attending: Student | Admitting: Student

## 2015-03-18 ENCOUNTER — Emergency Department: Payer: Medicaid Other

## 2015-03-18 DIAGNOSIS — W06XXXA Fall from bed, initial encounter: Secondary | ICD-10-CM | POA: Diagnosis not present

## 2015-03-18 DIAGNOSIS — Z91040 Latex allergy status: Secondary | ICD-10-CM | POA: Diagnosis not present

## 2015-03-18 DIAGNOSIS — F17210 Nicotine dependence, cigarettes, uncomplicated: Secondary | ICD-10-CM | POA: Insufficient documentation

## 2015-03-18 DIAGNOSIS — Y9289 Other specified places as the place of occurrence of the external cause: Secondary | ICD-10-CM | POA: Diagnosis not present

## 2015-03-18 DIAGNOSIS — E119 Type 2 diabetes mellitus without complications: Secondary | ICD-10-CM | POA: Diagnosis not present

## 2015-03-18 DIAGNOSIS — I10 Essential (primary) hypertension: Secondary | ICD-10-CM | POA: Diagnosis not present

## 2015-03-18 DIAGNOSIS — Z79899 Other long term (current) drug therapy: Secondary | ICD-10-CM | POA: Insufficient documentation

## 2015-03-18 DIAGNOSIS — Z880 Allergy status to penicillin: Secondary | ICD-10-CM | POA: Insufficient documentation

## 2015-03-18 DIAGNOSIS — S40012A Contusion of left shoulder, initial encounter: Secondary | ICD-10-CM | POA: Diagnosis not present

## 2015-03-18 DIAGNOSIS — S20212A Contusion of left front wall of thorax, initial encounter: Secondary | ICD-10-CM | POA: Diagnosis not present

## 2015-03-18 DIAGNOSIS — Y998 Other external cause status: Secondary | ICD-10-CM | POA: Insufficient documentation

## 2015-03-18 DIAGNOSIS — Y9389 Activity, other specified: Secondary | ICD-10-CM | POA: Diagnosis not present

## 2015-03-18 DIAGNOSIS — S4992XA Unspecified injury of left shoulder and upper arm, initial encounter: Secondary | ICD-10-CM | POA: Diagnosis present

## 2015-03-18 MED ORDER — HYDROCODONE-ACETAMINOPHEN 5-325 MG PO TABS
2.0000 | ORAL_TABLET | Freq: Once | ORAL | Status: AC
  Administered 2015-03-18: 2 via ORAL
  Filled 2015-03-18: qty 2

## 2015-03-18 MED ORDER — KETOROLAC TROMETHAMINE 60 MG/2ML IM SOLN
INTRAMUSCULAR | Status: AC
  Administered 2015-03-18: 60 mg via INTRAMUSCULAR
  Filled 2015-03-18: qty 2

## 2015-03-18 MED ORDER — ONDANSETRON 4 MG PO TBDP
ORAL_TABLET | ORAL | Status: AC
  Administered 2015-03-18: 4 mg via ORAL
  Filled 2015-03-18: qty 1

## 2015-03-18 MED ORDER — ONDANSETRON 4 MG PO TBDP
ORAL_TABLET | ORAL | Status: AC
  Filled 2015-03-18: qty 1

## 2015-03-18 MED ORDER — LORAZEPAM 2 MG/ML IJ SOLN
INTRAMUSCULAR | Status: AC
  Filled 2015-03-18: qty 1

## 2015-03-18 MED ORDER — HYDROCODONE-ACETAMINOPHEN 5-325 MG PO TABS
1.0000 | ORAL_TABLET | ORAL | Status: DC | PRN
Start: 2015-03-18 — End: 2017-05-25

## 2015-03-18 MED ORDER — KETOROLAC TROMETHAMINE 60 MG/2ML IM SOLN
60.0000 mg | Freq: Once | INTRAMUSCULAR | Status: AC
  Administered 2015-03-18: 60 mg via INTRAMUSCULAR

## 2015-03-18 MED ORDER — ONDANSETRON 4 MG PO TBDP
4.0000 mg | ORAL_TABLET | Freq: Once | ORAL | Status: AC
  Administered 2015-03-18: 4 mg via ORAL

## 2015-03-18 NOTE — ED Provider Notes (Signed)
[facility] Emergency Department Provider Note  ____________________________________________  Time seen: Approximately 8:52 AM  I have reviewed the triage vital signs and the nursing notes.   HISTORY  Chief Complaint Shoulder Pain  HPI Desiree Cruz is a 44 y.o. female is here today with complaint of left shoulder and rib pain after falling out of bed and hitting her night table. She states she did not hit her head and there was no loss of consciousness. She is continued to have shoulder and rib pain for the last 3 days. Patient states she has taken over-the-counter medications without any improvement. She continues to hold her arm next to her body to prevent range of motion which increases her pain. Currently she states her pain is a 10 out of 10. She denies any previous injury to her shoulder or ribs.   Past Medical History  Diagnosis Date  . CHF (congestive heart failure) (HCC)   . Asthma   . Migraines   . Diabetes mellitus without complication (HCC)   . Hypertension     There are no active problems to display for this patient.   Past Surgical History  Procedure Laterality Date  . Abdominal hysterectomy      Current Outpatient Rx  Name  Route  Sig  Dispense  Refill  . FLUoxetine (PROZAC) 20 MG capsule   Oral   Take 20 mg by mouth daily.         Marland Kitchen. gabapentin (NEURONTIN) 300 MG capsule   Oral   Take 300 mg by mouth 2 (two) times daily.         . metoprolol succinate (TOPROL-XL) 25 MG 24 hr tablet   Oral   Take 25 mg by mouth daily.         . mirtazapine (REMERON) 30 MG tablet   Oral   Take 30 mg by mouth at bedtime.         Marland Kitchen. HYDROcodone-acetaminophen (NORCO/VICODIN) 5-325 MG tablet   Oral   Take 1 tablet by mouth every 4 (four) hours as needed for moderate pain.   20 tablet   0   . moxifloxacin (VIGAMOX) 0.5 % ophthalmic solution   Right Eye   Place 1 drop into the right eye 3 (three) times daily. For 5 days   3 mL    0     Allergies Latex and Penicillins  No family history on file.  Social History Social History  Substance Use Topics  . Smoking status: Current Every Day Smoker -- 0.50 packs/day    Types: Cigarettes  . Smokeless tobacco: None  . Alcohol Use: No    Review of Systems Constitutional: No fever/chills Eyes: No visual changes. ENT: No sore throat. Cardiovascular: Denies chest pain. Respiratory: Denies shortness of breath. Gastrointestinal:  No nausea, no vomiting.  Genitourinary: Negative for dysuria. Musculoskeletal: Negative for back pain. Positive left shoulder pain and left rib pain. Skin: Negative for rash. Neurological: Negative for headaches, focal weakness or numbness.  10-point ROS otherwise negative.  ____________________________________________   PHYSICAL EXAM:  VITAL SIGNS: ED Triage Vitals  Enc Vitals Group     BP 03/18/15 0842 110/77 mmHg     Pulse Rate 03/18/15 0842 66     Resp 03/18/15 0842 18     Temp 03/18/15 0842 97.5 F (36.4 C)     Temp Source 03/18/15 0842 Oral     SpO2 03/18/15 0842 95 %     Weight 03/18/15 0837 248 lb (112.492  kg)     Height 03/18/15 0837  (1.753 m)     Head Cir --      Peak Flow --      Pain Score 03/18/15 0838 10     Pain Loc --      Pain Edu? --      Excl. in GC? --     Constitutional: Alert and oriented. Well appearing and in no acute distress. Eyes: Conjunctivae are normal. PERRL. EOMI. Head: Atraumatic. Nose: No congestion/rhinnorhea. Neck: No stridor.  No cervical tenderness on palpation. Range of motion without pain. Cardiovascular: Normal rate, regular rhythm. Grossly normal heart sounds.  Good peripheral circulation. Respiratory: Normal respiratory effort.  No retractions. Lungs CTAB. Gastrointestinal: Soft and nontender. No distention.  Musculoskeletal: Left shoulder no gross deformity was noted. Patient's range of motion is restricted secondary to pain. No edema, abrasions or ecchymosis was noted.  Patient is guarding range of motion secondary to pain. Examination of the chest wall left-sided no gross deformity. There is minimal tenderness on palpation of the ribs and more soft tissue tenderness was noted. No lower extremity tenderness nor edema.  No joint effusions. Normal gait was noted while patient was in the emergency room. Neurologic:  Normal speech and language. No gross focal neurologic deficits are appreciated. No gait instability. Skin:  Skin is warm, dry and intact. No rash noted. No ecchymosis, abrasions, or erythema was noted in the areas of trauma. Psychiatric: Mood and affect are normal. Speech and behavior are normal.  ____________________________________________   LABS (all labs ordered are listed, but only abnormal results are displayed)  Labs Reviewed - No data to display  RADIOLOGY  Left rib x-ray per radiologist no acute findings, no rib fracture. Left shoulder exam normal per radiologist was no fracture or dislocation. ____________________________________________   PROCEDURES  Procedure(s) performed: None  Critical Care performed: No  ____________________________________________   INITIAL IMPRESSION / ASSESSMENT AND PLAN / ED COURSE  Pertinent labs & imaging results that were available during my care of the patient were reviewed by me and considered in my medical decision making (see chart for details).  Patient was placed in a sling for support. Patient is given a prescription for Norco as needed for pain every 4 hours. She is continue with ice and elevation as needed. She is follow-up with her primary care doctor if any continued problems. ____________________________________________   FINAL CLINICAL IMPRESSION(S) / ED DIAGNOSES  Final diagnoses:  Contusion of left shoulder, initial encounter  Contusion of left chest wall, initial encounter      Tommi Rumps, PA-C 03/18/15 1307  Sharman Cheek, MD 03/18/15 [phone]

## 2015-03-18 NOTE — ED Notes (Signed)
Pt states she fell off the couch the other night and hit her left shoulder on a table and is having pain around the shoulder blade area

## 2015-03-18 NOTE — Discharge Instructions (Signed)
Contusion A contusion is a deep bruise. Contusions happen when an injury causes bleeding under the skin. Symptoms of bruising include pain, swelling, and discolored skin. The skin may turn blue, purple, or yellow. HOME CARE   Rest the injured area.  If told, put ice on the injured area.  Put ice in a plastic bag.  Place a towel between your skin and the bag.  Leave the ice on for 20 minutes, 2-3 times per day.  If told, put light pressure (compression) on the injured area using an elastic bandage. Make sure the bandage is not too tight. Remove it and put it back on as told by your doctor.  If possible, raise (elevate) the injured area above the level of your heart while you are sitting or lying down.  Take over-the-counter and prescription medicines only as told by your doctor. GET HELP IF:  Your symptoms do not get better after several days of treatment.  Your symptoms get worse.  You have trouble moving the injured area. GET HELP RIGHT AWAY IF:   You have very bad pain.  You have a loss of feeling (numbness) in a hand or foot.  Your hand or foot turns pale or cold.   This information is not intended to replace advice given to you by your health care provider. Make sure you discuss any questions you have with your health care provider.   Document Released: 09/28/2007 Document Revised: 12/31/2014 Document Reviewed: 08/27/2014 Elsevier Interactive Patient Education 2016 [employer]   Do not wear her sling for more than 4 days. Ice and elevate as needed for pain control. Norco one every 4 hours as needed for severe pain. Follow-[facility] with your doctor or [facility] if any continued problems.

## 2015-07-10 ENCOUNTER — Emergency Department
Admission: EM | Admit: 2015-07-10 | Discharge: 2015-07-10 | Disposition: A | Discharge status: Home / Self Care | Payer: Medicaid Other | Attending: Emergency Medicine | Admitting: Emergency Medicine

## 2015-07-10 ENCOUNTER — Encounter: Payer: Self-pay | Admitting: Emergency Medicine

## 2015-07-10 ENCOUNTER — Emergency Department: Payer: Medicaid Other

## 2015-07-10 DIAGNOSIS — R0789 Other chest pain: Secondary | ICD-10-CM | POA: Insufficient documentation

## 2015-07-10 DIAGNOSIS — J45998 Other asthma: Secondary | ICD-10-CM | POA: Insufficient documentation

## 2015-07-10 DIAGNOSIS — I10 Essential (primary) hypertension: Secondary | ICD-10-CM | POA: Diagnosis not present

## 2015-07-10 DIAGNOSIS — E119 Type 2 diabetes mellitus without complications: Secondary | ICD-10-CM | POA: Insufficient documentation

## 2015-07-10 DIAGNOSIS — F17210 Nicotine dependence, cigarettes, uncomplicated: Secondary | ICD-10-CM | POA: Diagnosis not present

## 2015-07-10 DIAGNOSIS — R079 Chest pain, unspecified: Secondary | ICD-10-CM

## 2015-07-10 DIAGNOSIS — I509 Heart failure, unspecified: Secondary | ICD-10-CM | POA: Insufficient documentation

## 2015-07-10 LAB — BASIC METABOLIC PANEL
Anion gap: 8 (ref 5–15)
BUN: 18 mg/dL (ref 6–20)
CHLORIDE: 101 mmol/L (ref 101–111)
CO2: 25 mmol/L (ref 22–32)
Calcium: 9 mg/dL (ref 8.9–10.3)
Creatinine, Ser: 0.72 mg/dL (ref 0.44–1.00)
GFR calc Af Amer: >60 mL/min (ref >60)
GLUCOSE: 127 mg/dL — AB (ref 65–99)
POTASSIUM: 3.1 mmol/L — AB (ref 3.5–5.1)
Sodium: 134 mmol/L — ABNORMAL LOW (ref 135–145)

## 2015-07-10 LAB — CBC
HEMATOCRIT: 41.6 % (ref 35.0–47.0)
HEMOGLOBIN: 14.1 g/dL (ref 12.0–16.0)
MCH: 31.6 pg (ref 26.0–34.0)
MCHC: 33.9 g/dL (ref 32.0–36.0)
MCV: 93 fL (ref 80.0–100.0)
Platelets: 184 10*3/uL (ref 150–440)
RBC: 4.47 MIL/uL (ref 3.80–5.20)
RDW: 12.5 % (ref 11.5–14.5)
WBC: 11.7 10*3/uL — ABNORMAL HIGH (ref 3.6–11.0)

## 2015-07-10 LAB — TROPONIN I
Troponin I: <0.03 ng/mL (ref <0.031)
Troponin I: <0.03 ng/mL (ref <0.031)

## 2015-07-10 MED ORDER — DIAZEPAM 5 MG PO TABS
10.0000 mg | ORAL_TABLET | Freq: Once | ORAL | Status: AC
  Administered 2015-07-10: 10 mg via ORAL
  Filled 2015-07-10: qty 2

## 2015-07-10 MED ORDER — DIAZEPAM 5 MG PO TABS: 5.0000 mg | ORAL_TABLET | Freq: Three times a day (TID) | ORAL | Status: DC | PRN

## 2015-07-10 NOTE — ED Notes (Signed)
Pt presents with cough and chest pain started today with some shortness of breath.

## 2015-07-10 NOTE — Discharge Instructions (Signed)
Nonspecific Chest Pain  °Chest pain can be caused by many different conditions. There is always a chance that your pain could be related to something serious, such as a heart attack or a blood clot in your lungs. Chest pain can also be caused by conditions that are not life-threatening. If you have chest pain, it is very important to follow up with your health care provider. °CAUSES  °Chest pain can be caused by: °· Heartburn. °· Pneumonia or bronchitis. °· Anxiety or stress. °· Inflammation around your heart (pericarditis) or lung (pleuritis or pleurisy). °· A blood clot in your lung. °· A collapsed lung (pneumothorax). It can develop suddenly on its own (spontaneous pneumothorax) or from trauma to the chest. °· Shingles infection (varicella-zoster virus). °· Heart attack. °· Damage to the bones, muscles, and cartilage that make up your chest wall. This can include: °¨ Bruised bones due to injury. °¨ Strained muscles or cartilage due to frequent or repeated coughing or overwork. °¨ Fracture to one or more ribs. °¨ Sore cartilage due to inflammation (costochondritis). °RISK FACTORS  °Risk factors for chest pain may include: °· Activities that increase your risk for trauma or injury to your chest. °· Respiratory infections or conditions that cause frequent coughing. °· Medical conditions or overeating that can cause heartburn. °· Heart disease or family history of heart disease. °· Conditions or health behaviors that increase your risk of developing a blood clot. °· Having had chicken pox (varicella zoster). °SIGNS AND SYMPTOMS °Chest pain can feel like: °· Burning or tingling on the surface of your chest or deep in your chest. °· Crushing, pressure, aching, or squeezing pain. °· Dull or sharp pain that is worse when you move, cough, or take a deep breath. °· Pain that is also felt in your back, neck, shoulder, or arm, or pain that spreads to any of these areas. °Your chest pain may come and go, or it may stay  constant. °DIAGNOSIS °Lab tests or other studies may be needed to find the cause of your pain. Your health care provider may have you take a test called an ambulatory ECG (electrocardiogram). An ECG records your heartbeat patterns at the time the test is performed. You may also have other tests, such as: °· Transthoracic echocardiogram (TTE). During echocardiography, sound waves are used to create a picture of all of the heart structures and to look at how blood flows through your heart. °· Transesophageal echocardiogram (TEE). This is a more advanced imaging test that obtains images from inside your body. It allows your health care provider to see your heart in finer detail. °· Cardiac monitoring. This allows your health care provider to monitor your heart rate and rhythm in real time. °· Holter monitor. This is a portable device that records your heartbeat and can help to diagnose abnormal heartbeats. It allows your health care provider to track your heart activity for several days, if needed. °· Stress tests. These can be done through exercise or by taking medicine that makes your heart beat more quickly. °· Blood tests. °· Imaging tests. °TREATMENT  °Your treatment depends on what is causing your chest pain. Treatment may include: °· Medicines. These may include: °¨ Acid blockers for heartburn. °¨ Anti-inflammatory medicine. °¨ Pain medicine for inflammatory conditions. °¨ Antibiotic medicine, if an infection is present. °¨ Medicines to dissolve blood clots. °¨ Medicines to treat coronary artery disease. °· Supportive care for conditions that do not require medicines. This may include: °¨ Resting. °¨ Applying heat   or cold packs to injured areas. °¨ Limiting activities until pain decreases. °HOME CARE INSTRUCTIONS °· If you were prescribed an antibiotic medicine, finish it all even if you start to feel better. °· Avoid any activities that bring on chest pain. °· Do not use any tobacco products, including  cigarettes, chewing tobacco, or electronic cigarettes. If you need help quitting, ask your health care provider. °· Do not drink alcohol. °· Take medicines only as directed by your health care provider. °· Keep all follow-up visits as directed by your health care provider. This is important. This includes any further testing if your chest pain does not go away. °· If heartburn is the cause for your chest pain, you may be told to keep your head raised (elevated) while sleeping. This reduces the chance that acid will go from your stomach into your esophagus. °· Make lifestyle changes as directed by your health care provider. These may include: °¨ Getting regular exercise. Ask your health care provider to suggest some activities that are safe for you. °¨ Eating a heart-healthy diet. A registered dietitian can help you to learn healthy eating options. °¨ Maintaining a healthy weight. °¨ Managing diabetes, if necessary. °¨ Reducing stress. °SEEK MEDICAL CARE IF: °· Your chest pain does not go away after treatment. °· You have a rash with blisters on your chest. °· You have a fever. °SEEK IMMEDIATE MEDICAL CARE IF:  °· Your chest pain is worse. °· You have an increasing cough, or you cough up blood. °· You have severe abdominal pain. °· You have severe weakness. °· You faint. °· You have chills. °· You have sudden, unexplained chest discomfort. °· You have sudden, unexplained discomfort in your arms, back, neck, or jaw. °· You have shortness of breath at any time. °· You suddenly start to sweat, or your skin gets clammy. °· You feel nauseous or you vomit. °· You suddenly feel light-headed or dizzy. °· Your heart begins to beat quickly, or it feels like it is skipping beats. °These symptoms may represent a serious problem that is an emergency. Do not wait to see if the symptoms will go away. Get medical help right away. Call your local emergency services (911 in the [place] Do not drive yourself to the hospital. °  °This  information is not intended to replace advice given to you by your health care provider. Make sure you discuss any questions you have with your health care provider. °  °Document Released: 01/19/2005 Document Revised: 05/02/2014 Document Reviewed: 11/15/2013 °Elsevier Interactive Patient Education ©2016 Elsevier Inc. ° °

## 2015-07-10 NOTE — ED Provider Notes (Addendum)
[facility] Emergency Department Provider Note     Time seen: ----------------------------------------- 1:09 PM on 07/10/2015 -----------------------------------------    I have reviewed the triage vital signs and the nursing notes.   HISTORY  Chief Complaint Chest Pain    HPI Desiree Cruz is a 45 y.o. female who presents ER with 4 days of chest pain that feels like someone is punching her in the chest. She also has had some cough and some shortness of breath. Patient states she is under a lot of stress, also is recently been treated for acid reflux. She's had a history of these symptoms before. Nothing made them better so far.   Past Medical History  Diagnosis Date  . CHF (congestive heart failure) (HCC)   . Asthma   . Migraines   . Diabetes mellitus without complication (HCC)   . Hypertension     There are no active problems to display for this patient.   Past Surgical History  Procedure Laterality Date  . Abdominal hysterectomy      Allergies Latex and Penicillins  Social History Social History  Substance Use Topics  . Smoking status: Current Every Day Smoker -- 0.50 packs/day    Types: Cigarettes  . Smokeless tobacco: None  . Alcohol Use: No    Review of Systems Constitutional: Negative for fever. Eyes: Negative for visual changes. ENT: Negative for sore throat. Cardiovascular: Positive for chest pain Respiratory: Positive for shortness of breath and cough Gastrointestinal: Negative for abdominal pain, vomiting and diarrhea. Genitourinary: Negative for dysuria. Musculoskeletal: Negative for back pain. Skin: Negative for rash. Neurological: Negative for headaches, focal weakness or numbness.  10-point ROS otherwise negative.  ____________________________________________   PHYSICAL EXAM:  VITAL SIGNS: ED Triage Vitals  Enc Vitals Group     BP 07/10/15 1025 103/76 mmHg     Pulse Rate 07/10/15 1025 67     Resp  07/10/15 1025 18     Temp 07/10/15 1025 97.7 F (36.5 C)     Temp Source 07/10/15 1025 Oral     SpO2 07/10/15 1025 96 %     Weight 07/10/15 1025 222 lb (100.699 kg)     Height 07/10/15 1025 5\' 9"  (1.753 m)     Head Cir --      Peak Flow --      Pain Score 07/10/15 1034 9     Pain Loc --      Pain Edu? --      Excl. in GC? --    Constitutional: Alert and oriented. Well appearing and in no distress. Eyes: Conjunctivae are normal. PERRL. Normal extraocular movements. ENT   Head: Normocephalic and atraumatic.   Nose: No congestion/rhinnorhea.   Mouth/Throat: Mucous membranes are moist.   Neck: No stridor. Cardiovascular: Normal rate, regular rhythm. Normal and symmetric distal pulses are present in all extremities. No murmurs, rubs, or gallops. Respiratory: Normal respiratory effort without tachypnea nor retractions. Breath sounds are clear and equal bilaterally. No wheezes/rales/rhonchi. Gastrointestinal: Soft and nontender. No distention. No abdominal bruits.  Musculoskeletal: Nontender with normal range of motion in all extremities. No joint effusions.  No lower extremity tenderness nor edema. Neurologic:  Normal speech and language. No gross focal neurologic deficits are appreciated. Speech is normal. No gait instability. Skin:  Skin is warm, dry and intact. No rash noted. Psychiatric: Mood and affect are normal. Speech and behavior are normal. Patient exhibits appropriate insight and judgment.  EKG: Interpreted by me, normal sinus rhythm with a  rate of 65 bpm, normal PR interval, normal QRS, normal QT interval. Normal axis. ____________________________________________  ED COURSE:  Pertinent labs & imaging results that were available during my care of the patient were reviewed by me and considered in my medical decision making (see chart for details). Patient is in no acute distress, will check cardiac labs, chest x-ray and  reevaluate. ____________________________________________    LABS (pertinent positives/negatives)  Labs Reviewed  BASIC METABOLIC PANEL - Abnormal; Notable for the following:    Sodium 134 (*)    Potassium 3.1 (*)    Glucose, Bld 127 (*)    All other components within normal limits  CBC - Abnormal; Notable for the following:    WBC 11.7 (*)    All other components within normal limits  TROPONIN I  TROPONIN I    RADIOLOGY  Chest x-ray reveals bronchitic changes  ____________________________________________  FINAL ASSESSMENT AND PLAN  Chest pain  Plan: Patient with labs and imaging as dictated above. Symptoms are likely anxiety or reflux related. She'll be discharged with anxiolytics and encouraged to have close follow-up with her doctor.Serial troponins have been negative here.   Emily Filbert, MD   Emily Filbert, MD 07/10/15 1311  Emily Filbert, MD 07/10/15 [phone]

## 2015-07-14 ENCOUNTER — Emergency Department
Admission: EM | Admit: 2015-07-14 | Discharge: 2015-07-14 | Disposition: A | Discharge status: Home / Self Care | Payer: Medicaid Other | Attending: Emergency Medicine | Admitting: Emergency Medicine

## 2015-07-14 DIAGNOSIS — F17210 Nicotine dependence, cigarettes, uncomplicated: Secondary | ICD-10-CM | POA: Diagnosis not present

## 2015-07-14 DIAGNOSIS — G43909 Migraine, unspecified, not intractable, without status migrainosus: Secondary | ICD-10-CM | POA: Diagnosis not present

## 2015-07-14 DIAGNOSIS — J069 Acute upper respiratory infection, unspecified: Secondary | ICD-10-CM | POA: Diagnosis not present

## 2015-07-14 DIAGNOSIS — Z79899 Other long term (current) drug therapy: Secondary | ICD-10-CM | POA: Insufficient documentation

## 2015-07-14 DIAGNOSIS — I509 Heart failure, unspecified: Secondary | ICD-10-CM | POA: Insufficient documentation

## 2015-07-14 DIAGNOSIS — R05 Cough: Secondary | ICD-10-CM | POA: Diagnosis present

## 2015-07-14 DIAGNOSIS — J45909 Unspecified asthma, uncomplicated: Secondary | ICD-10-CM | POA: Insufficient documentation

## 2015-07-14 DIAGNOSIS — E119 Type 2 diabetes mellitus without complications: Secondary | ICD-10-CM | POA: Diagnosis not present

## 2015-07-14 DIAGNOSIS — I110 Hypertensive heart disease with heart failure: Secondary | ICD-10-CM | POA: Insufficient documentation

## 2015-07-14 LAB — LIPASE, BLOOD: Lipase: 13 U/L (ref 11–51)

## 2015-07-14 LAB — URINALYSIS COMPLETE WITH MICROSCOPIC (ARMC ONLY)
Bacteria, UA: NONE SEEN
Glucose, UA: NEGATIVE mg/dL
Hgb urine dipstick: NEGATIVE
Leukocytes, UA: NEGATIVE
NITRITE: NEGATIVE
Protein, ur: 30 mg/dL — AB
SPECIFIC GRAVITY, URINE: 1.041 — AB (ref 1.005–1.030)
pH: 5 (ref 5.0–8.0)

## 2015-07-14 LAB — COMPREHENSIVE METABOLIC PANEL
ALK PHOS: 54 U/L (ref 38–126)
ALT: 13 U/L — AB (ref 14–54)
ANION GAP: 7 (ref 5–15)
AST: 14 U/L — ABNORMAL LOW (ref 15–41)
Albumin: 4 g/dL (ref 3.5–5.0)
BILIRUBIN TOTAL: 0.3 mg/dL (ref 0.3–1.2)
BUN: 13 mg/dL (ref 6–20)
CALCIUM: 8.5 mg/dL — AB (ref 8.9–10.3)
CO2: 26 mmol/L (ref 22–32)
CREATININE: 0.77 mg/dL (ref 0.44–1.00)
Chloride: 100 mmol/L — ABNORMAL LOW (ref 101–111)
GFR calc non Af Amer: >60 mL/min (ref >60)
Glucose, Bld: 141 mg/dL — ABNORMAL HIGH (ref 65–99)
Potassium: 3 mmol/L — ABNORMAL LOW (ref 3.5–5.1)
Sodium: 133 mmol/L — ABNORMAL LOW (ref 135–145)
TOTAL PROTEIN: 7.5 g/dL (ref 6.5–8.1)

## 2015-07-14 LAB — CBC
HCT: 40.5 % (ref 35.0–47.0)
Hemoglobin: 13.6 g/dL (ref 12.0–16.0)
MCH: 32 pg (ref 26.0–34.0)
MCHC: 33.7 g/dL (ref 32.0–36.0)
MCV: 95.1 fL (ref 80.0–100.0)
Platelets: 188 K/uL (ref 150–440)
RBC: 4.26 MIL/uL (ref 3.80–5.20)
RDW: 12.3 % (ref 11.5–14.5)
WBC: 10.5 K/uL (ref 3.6–11.0)

## 2015-07-14 MED ORDER — ONDANSETRON HCL 4 MG PO TABS: 4.0000 mg | ORAL_TABLET | Freq: Every day | ORAL | Status: DC | PRN

## 2015-07-14 MED ORDER — ONDANSETRON 4 MG PO TBDP
4.0000 mg | ORAL_TABLET | Freq: Once | ORAL | Status: AC
  Administered 2015-07-14: 4 mg via ORAL

## 2015-07-14 MED ORDER — ONDANSETRON 4 MG PO TBDP
ORAL_TABLET | ORAL | Status: AC
  Filled 2015-07-14: qty 1

## 2015-07-14 MED ORDER — GUAIFENESIN ER 600 MG PO TB12: 600.0000 mg | ORAL_TABLET | Freq: Two times a day (BID) | ORAL | Status: AC

## 2015-07-14 NOTE — Discharge Instructions (Signed)
Upper Respiratory Infection, Adult Most upper respiratory infections (URIs) are a viral infection of the air passages leading to the lungs. A URI affects the nose, throat, and upper air passages. The most common type of URI is nasopharyngitis and is typically referred to as "the common cold." URIs run their course and usually go away on their own. Most of the time, a URI does not require medical attention, but sometimes a bacterial infection in the upper airways can follow a viral infection. This is called a secondary infection. Sinus and middle ear infections are common types of secondary upper respiratory infections. Bacterial pneumonia can also complicate a URI. A URI can worsen asthma and chronic obstructive pulmonary disease (COPD). Sometimes, these complications can require emergency medical care and may be life threatening.  CAUSES Almost all URIs are caused by viruses. A virus is a type of germ and can spread from one person to another.  RISKS FACTORS You may be at risk for a URI if:   You smoke.   You have chronic heart or lung disease.  You have a weakened defense (immune) system.   You are very young or very old.   You have nasal allergies or asthma.  You work in crowded or poorly ventilated areas.  You work in health care facilities or schools. SIGNS AND SYMPTOMS  Symptoms typically develop 2-3 days after you come in contact with a cold virus. Most viral URIs last 7-10 days. However, viral URIs from the influenza virus (flu virus) can last 14-18 days and are typically more severe. Symptoms may include:   Runny or stuffy (congested) nose.   Sneezing.   Cough.   Sore throat.   Headache.   Fatigue.   Fever.   Loss of appetite.   Pain in your forehead, behind your eyes, and over your cheekbones (sinus pain).  Muscle aches.  DIAGNOSIS  Your health care provider may diagnose a URI by:  Physical exam.  Tests to check that your symptoms are not due to  another condition such as:  Strep throat.  Sinusitis.  Pneumonia.  Asthma. TREATMENT  A URI goes away on its own with time. It cannot be cured with medicines, but medicines may be prescribed or recommended to relieve symptoms. Medicines may help:  Reduce your fever.  Reduce your cough.  Relieve nasal congestion. HOME CARE INSTRUCTIONS   Take medicines only as directed by your health care provider.   Gargle warm saltwater or take cough drops to comfort your throat as directed by your health care provider.  Use a warm mist humidifier or inhale steam from a shower to increase air moisture. This may make it easier to breathe.  Drink enough fluid to keep your urine clear or pale yellow.   Eat soups and other clear broths and maintain good nutrition.   Rest as needed.   Return to work when your temperature has returned to normal or as your health care provider advises. You may need to stay home longer to avoid infecting others. You can also use a face mask and careful hand washing to prevent spread of the virus.  Increase the usage of your inhaler if you have asthma.   Do not use any tobacco products, including cigarettes, chewing tobacco, or electronic cigarettes. If you need help quitting, ask your health care provider. PREVENTION  The best way to protect yourself from getting a cold is to practice good hygiene.   Avoid oral or hand contact with people with cold   symptoms.   Wash your hands often if contact occurs.  There is no clear evidence that vitamin C, vitamin E, echinacea, or exercise reduces the chance of developing a cold. However, it is always recommended to get plenty of rest, exercise, and practice good nutrition.  SEEK MEDICAL CARE IF:   You are getting worse rather than better.   Your symptoms are not controlled by medicine.   You have chills.  You have worsening shortness of breath.  You have brown or red mucus.  You have yellow or brown nasal  discharge.  You have pain in your face, especially when you bend forward.  You have a fever.  You have swollen neck glands.  You have pain while swallowing.  You have white areas in the back of your throat. SEEK IMMEDIATE MEDICAL CARE IF:   You have severe or persistent:  Headache.  Ear pain.  Sinus pain.  Chest pain.  You have chronic lung disease and any of the following:  Wheezing.  Prolonged cough.  Coughing up blood.  A change in your usual mucus.  You have a stiff neck.  You have changes in your:  Vision.  Hearing.  Thinking.  Mood. MAKE SURE YOU:   Understand these instructions.  Will watch your condition.  Will get help right away if you are not doing well or get worse.   This information is not intended to replace advice given to you by your health care provider. Make sure you discuss any questions you have with your health care provider.   Document Released: 10/05/2000 Document Revised: 08/26/2014 Document Reviewed: 07/17/2013 Elsevier Interactive Patient Education 2016 [employer] Inc[employer]  

## 2015-07-14 NOTE — ED Notes (Signed)
Pt c/o cough, congestion, N/V since seen here on 3/17..Marland Kitchen

## 2015-07-16 NOTE — ED Provider Notes (Signed)
[facility] Emergency Department Provider Note  ____________________________________________    I have reviewed the triage vital signs and the nursing notes.   HISTORY  Chief Complaint Nausea and Emesis    HPI Desiree Cruz is a 45 y.o. female who presents with complaints of mild cough, nasal congestion, mild nausea for approximately 2 days. She thinks she may have the flu. She denies shortness of breath. No recent travel. No fevers or chills. No abdominal pain. She has been doing symptomatic treatment. No chest pain. No palpitations     Past Medical History  Diagnosis Date  . CHF (congestive heart failure) (HCC)   . Asthma   . Migraines   . Diabetes mellitus without complication (HCC)   . Hypertension     There are no active problems to display for this patient.   Past Surgical History  Procedure Laterality Date  . Abdominal hysterectomy      Current Outpatient Rx  Name  Route  Sig  Dispense  Refill  . diazepam (VALIUM) 5 MG tablet   Oral   Take 1 tablet (5 mg total) by mouth every 8 (eight) hours as needed for anxiety.   20 tablet   0   . FLUoxetine (PROZAC) 20 MG capsule   Oral   Take 20 mg by mouth daily.         Marland Kitchen. gabapentin (NEURONTIN) 300 MG capsule   Oral   Take 300 mg by mouth 2 (two) times daily.         Marland Kitchen. guaiFENesin (MUCINEX) 600 MG 12 hr tablet   Oral   Take 1 tablet (600 mg total) by mouth 2 (two) times daily.   60 tablet   2   . HYDROcodone-acetaminophen (NORCO/VICODIN) 5-325 MG tablet   Oral   Take 1 tablet by mouth every 4 (four) hours as needed for moderate pain.   20 tablet   0   . metoprolol succinate (TOPROL-XL) 25 MG 24 hr tablet   Oral   Take 25 mg by mouth daily.         . mirtazapine (REMERON) 30 MG tablet   Oral   Take 30 mg by mouth at bedtime.         . moxifloxacin (VIGAMOX) 0.5 % ophthalmic solution   Right Eye   Place 1 drop into the right eye 3 (three) times daily. For 5  days   3 mL   0   . ondansetron (ZOFRAN) 4 MG tablet   Oral   Take 1 tablet (4 mg total) by mouth daily as needed for nausea or vomiting.   20 tablet   1     Allergies Latex and Penicillins  No family history on file.  Social History Social History  Substance Use Topics  . Smoking status: Current Every Day Smoker -- 0.50 packs/day    Types: Cigarettes  . Smokeless tobacco: None  . Alcohol Use: No    Review of Systems  Constitutional: Subjective fever Eyes: Negative for redness ENT: Negative for sore throat. positive congestion Cardiovascular: Negative for chest pain Respiratory: Negative for shortness of breath. Positive cough Gastrointestinal: Negative for abdominal pain. Positive nausea Genitourinary: Negative for dysuria. Musculoskeletal: Negative for back pain. Skin: Negative for rash. Neurological: Negative for focal weakness Psychiatric: Anxiety    ____________________________________________   PHYSICAL EXAM:  VITAL SIGNS: ED Triage Vitals  Enc Vitals Group     BP 07/14/15 0806 119/78 mmHg     Pulse Rate  07/14/15 0806 63     Resp 07/14/15 0806 18     Temp 07/14/15 0806 97.5 F (36.4 C)     Temp Source 07/14/15 0806 Oral     SpO2 07/14/15 0806 97 %     Weight 07/14/15 0806 222 lb 3.2 oz (100.789 kg)     Height 07/14/15 0806  (1.753 m)     Head Cir --      Peak Flow --      Pain Score 07/14/15 0807 10     Pain Loc --      Pain Edu? --      Excl. in GC? --      Constitutional: Alert and oriented. Well appearing and in no distress.  Eyes: Conjunctivae are normal. No erythema or injection ENT   Head: Normocephalic and atraumatic.   Mouth/Throat: Mucous membranes are moist. Cardiovascular: Normal rate, regular rhythm. Normal and symmetric distal pulses are present in the upper extremities.  Respiratory: Normal respiratory effort without tachypnea nor retractions. Breath sounds are clear and equal bilaterally.  Gastrointestinal:  Soft and non-tender in all quadrants. No distention. There is no CVA tenderness. Genitourinary: deferred Musculoskeletal: Nontender with normal range of motion in all extremities. No lower extremity tenderness nor edema. Neurologic:  Normal speech and language. No gross focal neurologic deficits are appreciated. Skin:  Skin is warm, dry and intact. No rash noted. Psychiatric: Mood and affect are normal. Patient exhibits appropriate insight and judgment.  ____________________________________________    LABS (pertinent positives/negatives)  Labs Reviewed  COMPREHENSIVE METABOLIC PANEL - Abnormal; Notable for the following:    Sodium 133 (*)    Potassium 3.0 (*)    Chloride 100 (*)    Glucose, Bld 141 (*)    Calcium 8.5 (*)    AST 14 (*)    ALT 13 (*)    All other components within normal limits  URINALYSIS COMPLETEWITH MICROSCOPIC ([facility] ONLY) - Abnormal; Notable for the following:    Color, Urine AMBER (*)    APPearance CLOUDY (*)    Bilirubin Urine 2+ (*)    Ketones, ur TRACE (*)    Specific Gravity, Urine 1.041 (*)    Protein, ur 30 (*)    Squamous Epithelial / LPF 6-30 (*)    All other components within normal limits  LIPASE, BLOOD  CBC    ____________________________________________   EKG  None  ____________________________________________    RADIOLOGY  None  ____________________________________________   PROCEDURES  Procedure(s) performed: none  Critical Care performed: none  ____________________________________________   INITIAL IMPRESSION / ASSESSMENT AND PLAN / ED COURSE  Pertinent labs & imaging results that were available during my care of the patient were reviewed by me and considered in my medical decision making (see chart for details).  Patient well appearing and in no distress. History of present illness is most consistent with upper respiratory infection. Lab work is overall unremarkable, she does have a mild decrease in sodium and  potassium this appears to be chronic for her. Lung exam is benign and overall she is well-appearing. Recommended outpatient follow-up. Return precautions discussed   ___   FINAL CLINICAL IMPRESSION(S) / ED DIAGNOSES  Final diagnoses:  Upper respiratory infection          Jene Every, MD 07/16/15 1419

## 2015-07-19 ENCOUNTER — Encounter: Payer: Self-pay | Admitting: Emergency Medicine

## 2015-07-19 ENCOUNTER — Emergency Department
Admission: EM | Admit: 2015-07-19 | Discharge: 2015-07-19 | Disposition: A | Discharge status: Home / Self Care | Payer: Medicaid Other | Attending: Emergency Medicine | Admitting: Emergency Medicine

## 2015-07-19 DIAGNOSIS — I10 Essential (primary) hypertension: Secondary | ICD-10-CM | POA: Insufficient documentation

## 2015-07-19 DIAGNOSIS — E119 Type 2 diabetes mellitus without complications: Secondary | ICD-10-CM | POA: Insufficient documentation

## 2015-07-19 DIAGNOSIS — Z91040 Latex allergy status: Secondary | ICD-10-CM | POA: Insufficient documentation

## 2015-07-19 DIAGNOSIS — R05 Cough: Secondary | ICD-10-CM | POA: Diagnosis present

## 2015-07-19 DIAGNOSIS — J45909 Unspecified asthma, uncomplicated: Secondary | ICD-10-CM | POA: Insufficient documentation

## 2015-07-19 DIAGNOSIS — Z880 Allergy status to penicillin: Secondary | ICD-10-CM | POA: Diagnosis not present

## 2015-07-19 DIAGNOSIS — J069 Acute upper respiratory infection, unspecified: Secondary | ICD-10-CM | POA: Diagnosis not present

## 2015-07-19 DIAGNOSIS — Z79899 Other long term (current) drug therapy: Secondary | ICD-10-CM | POA: Diagnosis not present

## 2015-07-19 DIAGNOSIS — F17210 Nicotine dependence, cigarettes, uncomplicated: Secondary | ICD-10-CM | POA: Insufficient documentation

## 2015-07-19 DIAGNOSIS — J40 Bronchitis, not specified as acute or chronic: Secondary | ICD-10-CM

## 2015-07-19 MED ORDER — AZITHROMYCIN 250 MG PO TABS: ORAL_TABLET | ORAL | Status: AC

## 2015-07-19 MED ORDER — GUAIFENESIN-CODEINE 100-10 MG/5ML PO SOLN: 5.0000 mL | Freq: Four times a day (QID) | ORAL | Status: DC | PRN

## 2015-07-19 NOTE — ED Notes (Signed)
Reports saw doctor and they gave her muccinex but it did not help.

## 2015-07-19 NOTE — ED Provider Notes (Signed)
[facility] Emergency Department Provider Note  Time seen: 2:08 PM  I have reviewed the triage vital signs and the nursing notes.   HISTORY  Chief Complaint Cough and Sore Throat    HPI Desiree Cruz is a 45 y.o. female with a past medical history of CHF, asthma, diabetes, hypertension presents the emergency department with cough, congestion, sore throat. Patient states she has had a cough for approximately one week, now is productive of yellow/green sputum. Denies fever. States chest congestion, nasal congestion, and a sore throat which she believes is from coughing. Denies any chest pain or abdominal pain. Describes her cough as moderate. States it is keeping her up at night.     Past Medical History  Diagnosis Date  . CHF (congestive heart failure) (HCC)   . Asthma   . Migraines   . Diabetes mellitus without complication (HCC)   . Hypertension     There are no active problems to display for this patient.   Past Surgical History  Procedure Laterality Date  . Abdominal hysterectomy      Current Outpatient Rx  Name  Route  Sig  Dispense  Refill  . diazepam (VALIUM) 5 MG tablet   Oral   Take 1 tablet (5 mg total) by mouth every 8 (eight) hours as needed for anxiety.   20 tablet   0   . FLUoxetine (PROZAC) 20 MG capsule   Oral   Take 20 mg by mouth daily.         Marland Kitchen gabapentin (NEURONTIN) 300 MG capsule   Oral   Take 300 mg by mouth 2 (two) times daily.         Marland Kitchen guaiFENesin (MUCINEX) 600 MG 12 hr tablet   Oral   Take 1 tablet (600 mg total) by mouth 2 (two) times daily.   60 tablet   2   . HYDROcodone-acetaminophen (NORCO/VICODIN) 5-325 MG tablet   Oral   Take 1 tablet by mouth every 4 (four) hours as needed for moderate pain.   20 tablet   0   . metoprolol succinate (TOPROL-XL) 25 MG 24 hr tablet   Oral   Take 25 mg by mouth daily.         . mirtazapine (REMERON) 30 MG tablet   Oral   Take 30 mg by mouth at  bedtime.         . moxifloxacin (VIGAMOX) 0.5 % ophthalmic solution   Right Eye   Place 1 drop into the right eye 3 (three) times daily. For 5 days   3 mL   0   . ondansetron (ZOFRAN) 4 MG tablet   Oral   Take 1 tablet (4 mg total) by mouth daily as needed for nausea or vomiting.   20 tablet   1     Allergies Latex and Penicillins  History reviewed. No pertinent family history.  Social History Social History  Substance Use Topics  . Smoking status: Current Every Day Smoker -- 0.50 packs/day    Types: Cigarettes  . Smokeless tobacco: None  . Alcohol Use: No    Review of Systems Constitutional: Negative for fever. ENT: Positive for congestion. Cardiovascular: Negative for chest pain. Respiratory: Negative for shortness of breath. Positive for cough. Gastrointestinal: Negative for abdominal pain Genitourinary: Negative for dysuria. Neurological: Negative for headache 10-point ROS otherwise negative.  ____________________________________________   PHYSICAL EXAM:  VITAL SIGNS: ED Triage Vitals  Enc Vitals Group     BP  07/19/15 1241 126/76 mmHg     Pulse Rate 07/19/15 1241 77     Resp 07/19/15 1241 20     Temp 07/19/15 1241 97.9 F (36.6 C)     Temp Source 07/19/15 1241 Oral     SpO2 07/19/15 1241 97 %     Weight 07/19/15 1241 220 lb (99.791 kg)     Height 07/19/15 1241 5\' 9"  (1.753 m)     Head Cir --      Peak Flow --      Pain Score 07/19/15 1242 9     Pain Loc --      Pain Edu? --      Excl. in GC? --     Constitutional: Alert and oriented. Well appearing and in no distress. Frequent cough during exam. Eyes: Normal exam ENT   Head: Normocephalic and atraumatic.   Mouth/Throat: Mucous membranes are moist. Mild pharyngeal erythema no exudate. No hypertrophy. Cardiovascular: Normal rate, regular rhythm. No murmurs, rubs, or gallops. Respiratory: Normal respiratory effort without tachypnea nor retractions. Breath sounds are clear   Gastrointestinal: Soft and nontender. No distention. Musculoskeletal: Nontender with normal range of motion in all extremities. No edema or tenderness. Neurologic:  Normal speech and language. No gross focal neurologic deficits  Skin:  Skin is warm, dry and intact.  Psychiatric: Mood and affect are normal. Speech and behavior are normal.  ____________________________________________    INITIAL IMPRESSION / ASSESSMENT AND PLAN / ED COURSE  Pertinent labs & imaging results that were available during my care of the patient were reviewed by me and considered in my medical decision making (see chart for details).  Patient with cough, congestion 1 week. Patient has mild fragile erythema, no exudate or hypertrophy. Patient has a frequent cough during examination but clear lung sounds, no wheeze, rales or rhonchi. Exam most consistent with upper respiratory infection, viral versus bacterial bronchitis. We will cover with Zithromax and prescribed codeine based cough medication. I discussed with the patient the need to follow up with a primary care physician, she is agreeable. I discussed return precautions to which she is agreeable. At this time the patient appears very well, no distress, nontoxic, clear lung sounds I do not believe the patient would benefit from a chest x-ray this time.  ____________________________________________   FINAL CLINICAL IMPRESSION(S) / ED DIAGNOSES  Upper respiratory infection   Minna AntisKevin Riti Rollyson, MD 07/19/15 [phone]

## 2015-07-19 NOTE — Discharge Instructions (Signed)

## 2015-07-19 NOTE — ED Notes (Addendum)
Pt c/o sore throat and cough that has gotten worse.  Reports yellow productive cough.  Did see some red in cough but had just used chloresteptic spray.  Pt coughing in triage. Hurts when swallow.  Denies vomiting.

## 2015-08-13 ENCOUNTER — Encounter: Payer: Self-pay | Admitting: Emergency Medicine

## 2015-08-13 ENCOUNTER — Emergency Department: Payer: Medicaid Other

## 2015-08-13 ENCOUNTER — Emergency Department
Admission: EM | Admit: 2015-08-13 | Discharge: 2015-08-13 | Disposition: A | Discharge status: Home / Self Care | Payer: Medicaid Other | Attending: Emergency Medicine | Admitting: Emergency Medicine

## 2015-08-13 DIAGNOSIS — E119 Type 2 diabetes mellitus without complications: Secondary | ICD-10-CM | POA: Diagnosis not present

## 2015-08-13 DIAGNOSIS — Z79899 Other long term (current) drug therapy: Secondary | ICD-10-CM | POA: Diagnosis not present

## 2015-08-13 DIAGNOSIS — J45909 Unspecified asthma, uncomplicated: Secondary | ICD-10-CM | POA: Insufficient documentation

## 2015-08-13 DIAGNOSIS — F17210 Nicotine dependence, cigarettes, uncomplicated: Secondary | ICD-10-CM | POA: Diagnosis not present

## 2015-08-13 DIAGNOSIS — Y92009 Unspecified place in unspecified non-institutional (private) residence as the place of occurrence of the external cause: Secondary | ICD-10-CM | POA: Diagnosis not present

## 2015-08-13 DIAGNOSIS — R55 Syncope and collapse: Secondary | ICD-10-CM | POA: Insufficient documentation

## 2015-08-13 DIAGNOSIS — W010XXA Fall on same level from slipping, tripping and stumbling without subsequent striking against object, initial encounter: Secondary | ICD-10-CM | POA: Diagnosis not present

## 2015-08-13 DIAGNOSIS — S9031XA Contusion of right foot, initial encounter: Secondary | ICD-10-CM | POA: Insufficient documentation

## 2015-08-13 DIAGNOSIS — Y999 Unspecified external cause status: Secondary | ICD-10-CM | POA: Diagnosis not present

## 2015-08-13 DIAGNOSIS — I110 Hypertensive heart disease with heart failure: Secondary | ICD-10-CM | POA: Diagnosis not present

## 2015-08-13 DIAGNOSIS — M79671 Pain in right foot: Secondary | ICD-10-CM | POA: Diagnosis present

## 2015-08-13 DIAGNOSIS — Y939 Activity, unspecified: Secondary | ICD-10-CM | POA: Insufficient documentation

## 2015-08-13 DIAGNOSIS — I509 Heart failure, unspecified: Secondary | ICD-10-CM | POA: Insufficient documentation

## 2015-08-13 LAB — URINALYSIS COMPLETE WITH MICROSCOPIC (ARMC ONLY)
BACTERIA UA: NONE SEEN
Bilirubin Urine: NEGATIVE
Glucose, UA: NEGATIVE mg/dL
Hgb urine dipstick: NEGATIVE
Ketones, ur: NEGATIVE mg/dL
Nitrite: NEGATIVE
PH: 6 (ref 5.0–8.0)
PROTEIN: NEGATIVE mg/dL
Specific Gravity, Urine: 1.025 (ref 1.005–1.030)

## 2015-08-13 LAB — BASIC METABOLIC PANEL
Anion gap: 6 (ref 5–15)
BUN: 17 mg/dL (ref 6–20)
CHLORIDE: 107 mmol/L (ref 101–111)
CO2: 25 mmol/L (ref 22–32)
CREATININE: 0.61 mg/dL (ref 0.44–1.00)
Calcium: 9 mg/dL (ref 8.9–10.3)
GFR calc non Af Amer: >60 mL/min (ref >60)
Glucose, Bld: 120 mg/dL — ABNORMAL HIGH (ref 65–99)
POTASSIUM: 3.2 mmol/L — AB (ref 3.5–5.1)
SODIUM: 138 mmol/L (ref 135–145)

## 2015-08-13 LAB — CBC
HEMATOCRIT: 36 % (ref 35.0–47.0)
HEMOGLOBIN: 12.3 g/dL (ref 12.0–16.0)
MCH: 32.1 pg (ref 26.0–34.0)
MCHC: 34.2 g/dL (ref 32.0–36.0)
MCV: 93.9 fL (ref 80.0–100.0)
Platelets: 192 10*3/uL (ref 150–440)
RBC: 3.84 MIL/uL (ref 3.80–5.20)
RDW: 13.1 % (ref 11.5–14.5)
WBC: 8.5 10*3/uL (ref 3.6–11.0)

## 2015-08-13 MED ORDER — IBUPROFEN 800 MG PO TABS
800.0000 mg | ORAL_TABLET | Freq: Once | ORAL | Status: AC
  Administered 2015-08-13: 800 mg via ORAL

## 2015-08-13 MED ORDER — IBUPROFEN 800 MG PO TABS
ORAL_TABLET | ORAL | Status: AC
  Filled 2015-08-13: qty 1

## 2015-08-13 NOTE — ED Provider Notes (Signed)
[facility] Emergency Department Provider Note     Time seen: ----------------------------------------- 5:21 PM on 08/13/2015 -----------------------------------------    I have reviewed the triage vital signs and the nursing notes.   HISTORY  Chief Complaint Foot Pain and Near Syncope    HPI Desiree Cruz is a 45 y.o. female who presents ER for right foot pain. Patient states she tripped at her house when she was feeling somewhat weak. Patient had almost passed out earlier, felt hot at work and had not eaten anything but a hotdog wiener for breakfast, had not eaten any lunch. She reports a history of diabetes and her blood sugar was 60 so she drank a soda. She just complains of mild right foot pain otherwise denies complaints at this time   Past Medical History  Diagnosis Date  . CHF (congestive heart failure) (HCC)   . Asthma   . Migraines   . Diabetes mellitus without complication (HCC)   . Hypertension     There are no active problems to display for this patient.   Past Surgical History  Procedure Laterality Date  . Abdominal hysterectomy      Allergies Latex and Penicillins  Social History Social History  Substance Use Topics  . Smoking status: Current Every Day Smoker -- 0.50 packs/day    Types: Cigarettes  . Smokeless tobacco: None  . Alcohol Use: No    Review of Systems Constitutional: Negative for fever. Eyes: Negative for visual changes. ENT: Negative for sore throat. Cardiovascular: Negative for chest pain. Respiratory: Negative for shortness of breath. Gastrointestinal: Negative for abdominal pain, vomiting and diarrhea. Genitourinary: Negative for dysuria. Musculoskeletal: Positive for right foot pain Skin: Negative for rash. Neurological: Negative for headaches, positive for weakness earlier  10-point ROS otherwise negative.  ____________________________________________   PHYSICAL EXAM:  VITAL SIGNS: ED  Triage Vitals  Enc Vitals Group     BP 08/13/15 1659 114/70 mmHg     Pulse Rate 08/13/15 1659 101     Resp 08/13/15 1659 18     Temp 08/13/15 1659 98.5 F (36.9 C)     Temp Source 08/13/15 1659 Oral     SpO2 08/13/15 1659 99 %     Weight 08/13/15 1659 220 lb (99.791 kg)     Height 08/13/15 1659  (1.753 m)     Head Cir --      Peak Flow --      Pain Score 08/13/15 1700 10     Pain Loc --      Pain Edu? --      Excl. in GC? --     Constitutional: Alert and oriented. Well appearing and in no distress. Eyes: Conjunctivae are normal. PERRL. Normal extraocular movements. ENT   Head: Normocephalic and atraumatic.   Nose: No congestion/rhinnorhea.   Mouth/Throat: Mucous membranes are moist.   Neck: No stridor. Cardiovascular: Normal rate, regular rhythm. No murmurs, rubs, or gallops. Respiratory: Normal respiratory effort without tachypnea nor retractions. Breath sounds are clear and equal bilaterally. No wheezes/rales/rhonchi. Gastrointestinal: Soft and nontender. Normal bowel sounds Musculoskeletal: Mild tenderness on the dorsum of the right foot. Neurologic:  Normal speech and language. No gross focal neurologic deficits are appreciated.  Skin:  Skin is warm, dry and intact. No rash noted. Psychiatric: Mood and affect are normal. Speech and behavior are normal.   EKG: Interpreted by me, normal sinus rhythm with a rate of 96 bpm, normal PR interval, normal QRS, normal QT interval. Normal axis.  ____________________________________________  ED COURSE:  Pertinent labs & imaging results that were available during my care of the patient were reviewed by me and considered in my medical decision making (see chart for details). Patient is in no acute distress, will give her a meal tray and check basic labs. Likely multifactorial symptoms. ____________________________________________    LABS (pertinent positives/negatives)  Labs Reviewed  BASIC METABOLIC PANEL -  Abnormal; Notable for the following:    Potassium 3.2 (*)    Glucose, Bld 120 (*)    All other components within normal limits  URINALYSIS COMPLETEWITH MICROSCOPIC ([facility] ONLY) - Abnormal; Notable for the following:    Color, Urine YELLOW (*)    APPearance HAZY (*)    Leukocytes, UA 1+ (*)    Squamous Epithelial / LPF 6-30 (*)    All other components within normal limits  CBC  CBG MONITORING, ED    RADIOLOGY Images were viewed by me  Right foot x-rays  ____________________________________________  FINAL ASSESSMENT AND PLAN  Near-syncope, right foot contusion  Plan: Patient with labs and imaging as dictated above. Patient is in no acute distress, Ace wrap was applied to the right foot. She ate a full meal and now feels better. She is stable for outpatient follow-up with her doctor.   Emily FilbertWilliams, Jonathan E, MD   Emily FilbertJonathan E Williams, MD 08/13/15 [phone]

## 2015-08-13 NOTE — Discharge Instructions (Signed)
Near-Syncope Near-syncope (commonly known as near fainting) is sudden weakness, dizziness, or feeling like you might pass out. During an episode of near-syncope, you may also develop pale skin, have tunnel vision, or feel sick to your stomach (nauseous). Near-syncope may occur when getting up after sitting or while standing for a long time. It is caused by a sudden decrease in blood flow to the brain. This decrease can result from various causes or triggers, most of which are not serious. However, because near-syncope can sometimes be a sign of something serious, a medical evaluation is required. The specific cause is often not determined. HOME CARE INSTRUCTIONS  Monitor your condition for any changes. The following actions may help to alleviate any discomfort you are experiencing:  Have someone stay with you until you feel stable.  Lie down right away and prop your feet up if you start feeling like you might faint. Breathe deeply and steadily. Wait until all the symptoms have passed. Most of these episodes last only a few minutes. You may feel tired for several hours.   Drink enough fluids to keep your urine clear or pale yellow.   If you are taking blood pressure or heart medicine, get up slowly when seated or lying down. Take several minutes to sit and then stand. This can reduce dizziness.  Follow up with your health care provider as directed. SEEK IMMEDIATE MEDICAL CARE IF:   You have a severe headache.   You have unusual pain in the chest, abdomen, or back.   You are bleeding from the mouth or rectum, or you have black or tarry stool.   You have an irregular or very fast heartbeat.   You have repeated fainting or have seizure-like jerking during an episode.   You faint when sitting or lying down.   You have confusion.   You have difficulty walking.   You have severe weakness.   You have vision problems.  MAKE SURE YOU:   Understand these instructions.  Will  watch your condition.  Will get help right away if you are not doing well or get worse.   This information is not intended to replace advice given to you by your health care provider. Make sure you discuss any questions you have with your health care provider.   Document Released: 04/11/2005 Document Revised: 04/16/2013 Document Reviewed: 09/14/2012 Elsevier Interactive Patient Education 2016 [employer]  Foot Contusion  A foot contusion is a deep bruise to the foot. Contusions happen when an injury causes bleeding under the skin. Signs of bruising include pain, puffiness (swelling), and discolored skin. The contusion may turn blue, purple, or yellow. HOME CARE  Put ice on the injured area.  Put ice in a plastic bag.  Place a towel between your skin and the bag.  Leave the ice on for 15-20 minutes, 03-04 times a day.  Only take medicines as told by your doctor.  Use an elastic wrap only as told. You may remove the wrap for sleeping, showering, and bathing. Take the wrap off if you lose feeling (numb) in your toes, or they turn blue or cold. Put the wrap on more loosely.  Keep the foot raised (elevated) with pillows.  If your foot hurts, avoid standing or walking.  When your doctor says it is okay to use your foot, start using it slowly. If you have pain, lessen how much you use your foot.  See your doctor as told. GET HELP RIGHT AWAY IF:   You  have more redness, puffiness, or pain in your foot.  Your puffiness or pain does not get better with medicine.  You lose feeling in your foot, or you cannot move your toes.  Your foot turns cold or blue.  You have pain when you move your toes.  Your foot feels warm.  Your contusion does not get better in 2 days. MAKE SURE YOU:   Understand these instructions.  Will watch this condition.  Will get help right away if you or your child is not doing well or gets worse.   This information is not intended to replace advice  given to you by your health care provider. Make sure you discuss any questions you have with your health care provider.   Document Released: 01/19/2008 Document Revised: 10/11/2011 Document Reviewed: 12/16/2014 Elsevier Interactive Patient Education [employer]

## 2015-08-13 NOTE — ED Notes (Signed)
Pt discharged home after verbalizing understanding of discharge instructions; nad noted. 

## 2015-08-13 NOTE — ED Notes (Signed)
Patient presents to the ED with right foot pain.  Patient states she tripped at her house when she felt fairly weak and almost passed out.  Patient reports a second episode of almost passing out earlier today.  Patient reports history of diabetes and checked her sugar today and it was 60 so she drank a soda.  Patient is alert and oriented x 4 at this time.

## 2015-08-13 NOTE — ED Notes (Signed)
Pt reports that she fell earlier, presumably related to an episode of hypoglycemia. Pt has some swelling and tenderness to top of right foot. Pt alert & oriented, eating a lunch tray during assessment. NAD noted.

## 2015-09-03 ENCOUNTER — Emergency Department
Admission: EM | Admit: 2015-09-03 | Discharge: 2015-09-03 | Disposition: A | Discharge status: Home / Self Care | Payer: Medicaid Other | Attending: Emergency Medicine | Admitting: Emergency Medicine

## 2015-09-03 ENCOUNTER — Encounter: Payer: Self-pay | Admitting: Emergency Medicine

## 2015-09-03 DIAGNOSIS — E119 Type 2 diabetes mellitus without complications: Secondary | ICD-10-CM | POA: Insufficient documentation

## 2015-09-03 DIAGNOSIS — I110 Hypertensive heart disease with heart failure: Secondary | ICD-10-CM | POA: Diagnosis not present

## 2015-09-03 DIAGNOSIS — I509 Heart failure, unspecified: Secondary | ICD-10-CM | POA: Diagnosis not present

## 2015-09-03 DIAGNOSIS — F17210 Nicotine dependence, cigarettes, uncomplicated: Secondary | ICD-10-CM | POA: Diagnosis not present

## 2015-09-03 DIAGNOSIS — R110 Nausea: Secondary | ICD-10-CM | POA: Insufficient documentation

## 2015-09-03 DIAGNOSIS — R51 Headache: Secondary | ICD-10-CM | POA: Diagnosis not present

## 2015-09-03 DIAGNOSIS — R519 Headache, unspecified: Secondary | ICD-10-CM

## 2015-09-03 DIAGNOSIS — J45909 Unspecified asthma, uncomplicated: Secondary | ICD-10-CM | POA: Insufficient documentation

## 2015-09-03 LAB — CBC WITH DIFFERENTIAL/PLATELET
BASOS ABS: 0.1 10*3/uL (ref 0–0.1)
BASOS PCT: 1 %
EOS ABS: 0.6 10*3/uL (ref 0–0.7)
EOS PCT: 7 %
HCT: 38.9 % (ref 35.0–47.0)
Hemoglobin: 13.2 g/dL (ref 12.0–16.0)
Lymphocytes Relative: 35 %
Lymphs Abs: 3 10*3/uL (ref 1.0–3.6)
MCH: 31.8 pg (ref 26.0–34.0)
MCHC: 33.9 g/dL (ref 32.0–36.0)
MCV: 93.9 fL (ref 80.0–100.0)
MONO ABS: 0.4 10*3/uL (ref 0.2–0.9)
Monocytes Relative: 5 %
Neutro Abs: 4.5 10*3/uL (ref 1.4–6.5)
Neutrophils Relative %: 52 %
Platelets: 203 10*3/uL (ref 150–440)
RBC: 4.14 MIL/uL (ref 3.80–5.20)
RDW: 12.9 % (ref 11.5–14.5)
WBC: 8.7 10*3/uL (ref 3.6–11.0)

## 2015-09-03 LAB — BASIC METABOLIC PANEL
Anion gap: 9 (ref 5–15)
BUN: 21 mg/dL — ABNORMAL HIGH (ref 6–20)
CALCIUM: 9.1 mg/dL (ref 8.9–10.3)
CO2: 25 mmol/L (ref 22–32)
CREATININE: 0.69 mg/dL (ref 0.44–1.00)
Chloride: 104 mmol/L (ref 101–111)
GFR calc non Af Amer: >60 mL/min (ref >60)
GLUCOSE: 127 mg/dL — AB (ref 65–99)
Potassium: 3 mmol/L — ABNORMAL LOW (ref 3.5–5.1)
Sodium: 138 mmol/L (ref 135–145)

## 2015-09-03 LAB — TROPONIN I: Troponin I: <0.03 ng/mL (ref <0.031)

## 2015-09-03 MED ORDER — ONDANSETRON 4 MG PO TBDP
ORAL_TABLET | ORAL | Status: AC
  Administered 2015-09-03: 4 mg via ORAL
  Filled 2015-09-03: qty 1

## 2015-09-03 MED ORDER — SODIUM CHLORIDE 0.9 % IV BOLUS (SEPSIS): 1000.0000 mL | Freq: Once | INTRAVENOUS | Status: DC

## 2015-09-03 MED ORDER — PROCHLORPERAZINE EDISYLATE 5 MG/ML IJ SOLN
10.0000 mg | Freq: Once | INTRAMUSCULAR | Status: DC
  Filled 2015-09-03: qty 2

## 2015-09-03 MED ORDER — ONDANSETRON 4 MG PO TBDP
4.0000 mg | ORAL_TABLET | Freq: Once | ORAL | Status: AC
  Administered 2015-09-03: 4 mg via ORAL

## 2015-09-03 MED ORDER — POTASSIUM CHLORIDE CRYS ER 20 MEQ PO TBCR
40.0000 meq | EXTENDED_RELEASE_TABLET | Freq: Once | ORAL | Status: AC
  Administered 2015-09-03: 40 meq via ORAL
  Filled 2015-09-03: qty 2

## 2015-09-03 NOTE — ED Provider Notes (Signed)
[facility] Emergency Department Provider Note   ____________________________________________  Time seen: ~2040  I have reviewed the triage vital signs and the nursing notes.   HISTORY  Chief Complaint Headache   History limited by: Not Limited   HPI Desiree Cruz is a 45 y.o. female who presents to the emergency department today with primary concerns for headache and lightheadedness and confusion. Patient states that this started today at work. She states it started this afternoon. It has gradually gotten better. She states she felt like how she guesses one feels of the ET tube any drugs. She states she was at work and felt like she is moving a little too slow. She states she was having a headache at this time. In addition she was having some nausea. The time my examination the patient states she is feeling better. She denies any recent fevers. Denies any recent vomiting or diarrhea.   Past Medical History  Diagnosis Date  . CHF (congestive heart failure) (HCC)   . Asthma   . Migraines   . Diabetes mellitus without complication (HCC)   . Hypertension     There are no active problems to display for this patient.   Past Surgical History  Procedure Laterality Date  . Abdominal hysterectomy      Current Outpatient Rx  Name  Route  Sig  Dispense  Refill  . diazepam (VALIUM) 5 MG tablet   Oral   Take 1 tablet (5 mg total) by mouth every 8 (eight) hours as needed for anxiety.   20 tablet   0   . FLUoxetine (PROZAC) 20 MG capsule   Oral   Take 20 mg by mouth daily.         Marland Kitchen. gabapentin (NEURONTIN) 300 MG capsule   Oral   Take 300 mg by mouth 2 (two) times daily.         Marland Kitchen. guaiFENesin (MUCINEX) 600 MG 12 hr tablet   Oral   Take 1 tablet (600 mg total) by mouth 2 (two) times daily.   60 tablet   2   . guaiFENesin-codeine 100-10 MG/5ML syrup   Oral   Take 5 mLs by mouth every 6 (six) hours as needed for cough.   120 mL   0   .  HYDROcodone-acetaminophen (NORCO/VICODIN) 5-325 MG tablet   Oral   Take 1 tablet by mouth every 4 (four) hours as needed for moderate pain.   20 tablet   0   . metoprolol succinate (TOPROL-XL) 25 MG 24 hr tablet   Oral   Take 25 mg by mouth daily.         . mirtazapine (REMERON) 30 MG tablet   Oral   Take 30 mg by mouth at bedtime.         . moxifloxacin (VIGAMOX) 0.5 % ophthalmic solution   Right Eye   Place 1 drop into the right eye 3 (three) times daily. For 5 days   3 mL   0   . ondansetron (ZOFRAN) 4 MG tablet   Oral   Take 1 tablet (4 mg total) by mouth daily as needed for nausea or vomiting.   20 tablet   1     Allergies Latex and Penicillins  History reviewed. No pertinent family history.  Social History Social History  Substance Use Topics  . Smoking status: Current Every Day Smoker -- 0.50 packs/day    Types: Cigarettes  . Smokeless tobacco: None  . Alcohol  Use: No    Review of Systems  Constitutional: Negative for fever. Cardiovascular: Negative for chest pain. Respiratory: Negative for shortness of breath. Gastrointestinal: Negative for abdominal pain, vomiting and diarrhea.Positive for nausea. Genitourinary: Negative for dysuria. Musculoskeletal: Negative for back pain. Skin: Negative for rash. Neurological: Positive for headache   10-point ROS otherwise negative.  ____________________________________________   PHYSICAL EXAM:  VITAL SIGNS: ED Triage Vitals  Enc Vitals Group     BP 09/03/15 1950 105/78 mmHg     Pulse Rate 09/03/15 1950 84     Resp --      Temp 09/03/15 1950 98.3 F (36.8 C)     Temp Source 09/03/15 1950 Oral     SpO2 09/03/15 1950 97 %     Weight 09/03/15 1950 222 lb (100.699 kg)     Height 09/03/15 1950  (1.753 m)     Head Cir --      Peak Flow --      Pain Score 09/03/15 1950 10   Constitutional: Alert and oriented. Well appearing and in no distress. Eyes: Conjunctivae are normal. PERRL. Normal  extraocular movements. ENT   Head: Normocephalic and atraumatic.   Nose: No congestion/rhinnorhea.   Mouth/Throat: Mucous membranes are moist.   Neck: No stridor. Hematological/Lymphatic/Immunilogical: No cervical lymphadenopathy. Cardiovascular: Normal rate, regular rhythm.  No murmurs, rubs, or gallops. Respiratory: Normal respiratory effort without tachypnea nor retractions. Breath sounds are clear and equal bilaterally. No wheezes/rales/rhonchi. Gastrointestinal: Soft and nontender. No distention.  Genitourinary: Deferred Musculoskeletal: Normal range of motion in all extremities. No joint effusions.  No lower extremity tenderness nor edema. Neurologic:  Normal speech and language. No gross focal neurologic deficits are appreciated.  Skin:  Skin is warm, dry and intact. No rash noted. Psychiatric: Mood and affect are normal. Speech and behavior are normal. Patient exhibits appropriate insight and judgment.  ____________________________________________    LABS (pertinent positives/negatives)  Labs Reviewed  BASIC METABOLIC PANEL - Abnormal; Notable for the following:    Potassium 3.0 (*)    Glucose, Bld 127 (*)    BUN 21 (*)    All other components within normal limits  CBC WITH DIFFERENTIAL/PLATELET  TROPONIN I     ____________________________________________   EKG  None  ____________________________________________    RADIOLOGY  None   ____________________________________________   PROCEDURES  Procedure(s) performed: None  Critical Care performed: No  ____________________________________________   INITIAL IMPRESSION / ASSESSMENT AND PLAN / ED COURSE  Pertinent labs & imaging results that were available during my care of the patient were reviewed by me and considered in my medical decision making (see chart for details).  She presented to the emergency department today with concerns for some headache, confusion and nausea. At the time my  exam she states she is feeling better. No focal neuro deficits on exam. Will check blood work. Did offer to give patient IV medication fluids however she declined.  Blood work without any concerning findings save for mildly low potassium. It does appear to be somewhat chronic however will reach complete here in the emergency department.  ____________________________________________   FINAL CLINICAL IMPRESSION(S) / ED DIAGNOSES  Final diagnoses:  None     Phineas Semen, MD 09/03/15 2152

## 2015-09-03 NOTE — Discharge Instructions (Signed)
Please seek medical attention for any high fevers, chest pain, shortness of breath, change in behavior, persistent vomiting, bloody stool or any other new or concerning symptoms.   Nausea, Adult Nausea means you feel sick to your stomach or need to throw up (vomit). It may be a sign of a more serious problem. If nausea gets worse, you may throw up. If you throw up a lot, you may lose too much body fluid (dehydration). HOME CARE   Get plenty of rest.  Ask your doctor how to replace body fluid losses (rehydrate).  Eat small amounts of food. Sip liquids more often.  Take all medicines as told by your doctor. GET HELP RIGHT AWAY IF:  You have a fever.  You pass out (faint).  You keep throwing up or have blood in your throw up.  You are very weak, have dry lips or a dry mouth, or you are very thirsty (dehydrated).  You have dark or bloody poop (stool).  You have very bad chest or belly (abdominal) pain.  You do not get better after 2 days, or you get worse.  You have a headache. MAKE SURE YOU:  Understand these instructions.  Will watch your condition.  Will get help right away if you are not doing well or get worse.   This information is not intended to replace advice given to you by your health care provider. Make sure you discuss any questions you have with your health care provider.   Document Released: 03/31/2011 Document Revised: 07/04/2011 Document Reviewed: 03/31/2011 Elsevier Interactive Patient Education [employer]2016 [employer] Inc[employer]

## 2015-09-03 NOTE — ED Notes (Signed)
Pt states she was at work today with a migraine headache and started feeling confused. Pt states:  "I was moving slow, like stupid, like I was on drugs or something." Pt states: "I feel like I am starting to get back to normal." Pt reports history of migraines.

## 2015-09-03 NOTE — ED Notes (Signed)
Pt to ER states she has hx of migraine headaches.  States headache that started around 4:30pm this afternoon.  Pt c/o disorientation.  States she drove herself to hospital.  States nausea.  States feels faint.

## 2015-09-22 ENCOUNTER — Other Ambulatory Visit: Payer: Self-pay | Admitting: Internal Medicine

## 2015-09-22 DIAGNOSIS — Z1231 Encounter for screening mammogram for malignant neoplasm of breast: Secondary | ICD-10-CM

## 2015-10-01 ENCOUNTER — Ambulatory Visit
Admission: RE | Admit: 2015-10-01 | Discharge: 2015-10-01 | Disposition: A | Discharge status: Home / Self Care | Payer: Medicaid Other | Source: Ambulatory Visit | Attending: Internal Medicine | Admitting: Internal Medicine

## 2015-10-01 DIAGNOSIS — Z1231 Encounter for screening mammogram for malignant neoplasm of breast: Secondary | ICD-10-CM

## 2015-10-08 ENCOUNTER — Emergency Department
Admission: EM | Admit: 2015-10-08 | Discharge: 2015-10-08 | Disposition: A | Discharge status: Home / Self Care | Payer: Medicaid Other | Attending: Emergency Medicine | Admitting: Emergency Medicine

## 2015-10-08 ENCOUNTER — Encounter: Payer: Self-pay | Admitting: Emergency Medicine

## 2015-10-08 ENCOUNTER — Emergency Department: Payer: Medicaid Other

## 2015-10-08 DIAGNOSIS — S93401A Sprain of unspecified ligament of right ankle, initial encounter: Secondary | ICD-10-CM | POA: Diagnosis not present

## 2015-10-08 DIAGNOSIS — I110 Hypertensive heart disease with heart failure: Secondary | ICD-10-CM | POA: Insufficient documentation

## 2015-10-08 DIAGNOSIS — J45909 Unspecified asthma, uncomplicated: Secondary | ICD-10-CM | POA: Diagnosis not present

## 2015-10-08 DIAGNOSIS — X58XXXA Exposure to other specified factors, initial encounter: Secondary | ICD-10-CM | POA: Insufficient documentation

## 2015-10-08 DIAGNOSIS — Y999 Unspecified external cause status: Secondary | ICD-10-CM | POA: Insufficient documentation

## 2015-10-08 DIAGNOSIS — Y9289 Other specified places as the place of occurrence of the external cause: Secondary | ICD-10-CM | POA: Diagnosis not present

## 2015-10-08 DIAGNOSIS — Z79899 Other long term (current) drug therapy: Secondary | ICD-10-CM | POA: Insufficient documentation

## 2015-10-08 DIAGNOSIS — Y9301 Activity, walking, marching and hiking: Secondary | ICD-10-CM | POA: Diagnosis not present

## 2015-10-08 DIAGNOSIS — E119 Type 2 diabetes mellitus without complications: Secondary | ICD-10-CM | POA: Diagnosis not present

## 2015-10-08 DIAGNOSIS — I509 Heart failure, unspecified: Secondary | ICD-10-CM | POA: Insufficient documentation

## 2015-10-08 DIAGNOSIS — F17210 Nicotine dependence, cigarettes, uncomplicated: Secondary | ICD-10-CM | POA: Insufficient documentation

## 2015-10-08 DIAGNOSIS — S99911A Unspecified injury of right ankle, initial encounter: Secondary | ICD-10-CM | POA: Diagnosis present

## 2015-10-08 DIAGNOSIS — Z91040 Latex allergy status: Secondary | ICD-10-CM | POA: Insufficient documentation

## 2015-10-08 MED ORDER — TRAMADOL HCL 50 MG PO TABS: 50.0000 mg | ORAL_TABLET | Freq: Four times a day (QID) | ORAL | Status: AC | PRN

## 2015-10-08 MED ORDER — IBUPROFEN 800 MG PO TABS
800.0000 mg | ORAL_TABLET | Freq: Once | ORAL | Status: AC
  Administered 2015-10-08: 800 mg via ORAL
  Filled 2015-10-08: qty 1

## 2015-10-08 NOTE — ED Notes (Signed)
Pt went to step down off her porch and hurt her right lower leg. No deformity noted, no swelling or bruising. Nad.

## 2015-10-08 NOTE — ED Provider Notes (Signed)
[facility] Emergency Department Provider Note  ____________________________________________  Time seen: Approximately 7:26 PM  I have reviewed the triage vital signs and the nursing notes.   HISTORY  Chief Complaint Leg Pain    HPI Desiree Cruz is a 45 y.o. female with history of chronic foot pain who reports going down stairs earlier today and feeling a sharp pain shooting up her leg. She was unable to go to work today. Having difficulty walking because of the pain. She denies knee pain. She has minimal swelling. No redness. She has an appointment with podiatry next week   Past Medical History  Diagnosis Date  . CHF (congestive heart failure) (HCC)   . Asthma   . Migraines   . Diabetes mellitus without complication (HCC)   . Hypertension     There are no active problems to display for this patient.   Past Surgical History  Procedure Laterality Date  . Abdominal hysterectomy      Current Outpatient Rx  Name  Route  Sig  Dispense  Refill  . diazepam (VALIUM) 5 MG tablet   Oral   Take 1 tablet (5 mg total) by mouth every 8 (eight) hours as needed for anxiety.   20 tablet   0   . FLUoxetine (PROZAC) 20 MG capsule   Oral   Take 20 mg by mouth daily.         Marland Kitchen gabapentin (NEURONTIN) 300 MG capsule   Oral   Take 300 mg by mouth 2 (two) times daily.         Marland Kitchen guaiFENesin (MUCINEX) 600 MG 12 hr tablet   Oral   Take 1 tablet (600 mg total) by mouth 2 (two) times daily.   60 tablet   2   . guaiFENesin-codeine 100-10 MG/5ML syrup   Oral   Take 5 mLs by mouth every 6 (six) hours as needed for cough.   120 mL   0   . HYDROcodone-acetaminophen (NORCO/VICODIN) 5-325 MG tablet   Oral   Take 1 tablet by mouth every 4 (four) hours as needed for moderate pain.   20 tablet   0   . metoprolol succinate (TOPROL-XL) 25 MG 24 hr tablet   Oral   Take 25 mg by mouth daily.         . mirtazapine (REMERON) 30 MG tablet   Oral   Take  30 mg by mouth at bedtime.         . moxifloxacin (VIGAMOX) 0.5 % ophthalmic solution   Right Eye   Place 1 drop into the right eye 3 (three) times daily. For 5 days   3 mL   0   . ondansetron (ZOFRAN) 4 MG tablet   Oral   Take 1 tablet (4 mg total) by mouth daily as needed for nausea or vomiting.   20 tablet   1   . traMADol (ULTRAM) 50 MG tablet   Oral   Take 1 tablet (50 mg total) by mouth every 6 (six) hours as needed.   20 tablet   0     Allergies Latex and Penicillins  Family History  Problem Relation Age of Onset  . Breast cancer Neg Hx     Social History Social History  Substance Use Topics  . Smoking status: Current Every Day Smoker -- 0.50 packs/day    Types: Cigarettes  . Smokeless tobacco: None  . Alcohol Use: No    Review of Systems Constitutional: No  fever/chills Eyes: No visual changes. ENT: No sore throat. Cardiovascular: Denies chest pain. Respiratory: Denies shortness of breath. Musculoskeletal: per hpi Skin: Negative for rash. Neurological: Negative for headaches, focal weakness or numbness. 10-point ROS otherwise negative.  ____________________________________________   PHYSICAL EXAM:  VITAL SIGNS: ED Triage Vitals  Enc Vitals Group     BP 10/08/15 1815 119/97 mmHg     Pulse Rate 10/08/15 1815 94     Resp 10/08/15 1815 20     Temp 10/08/15 1815 98.3 F (36.8 C)     Temp Source 10/08/15 1815 Oral     SpO2 10/08/15 1815 97 %     Weight --      Height --      Head Cir --      Peak Flow --      Pain Score 10/08/15 1816 10     Pain Loc --      Pain Edu? --      Excl. in GC? --     Constitutional: Alert and oriented. Well appearing and in no acute distress. Eyes: Conjunctivae are normal. PERRL. EOMI. Cardiovascular: Normal rate, regular rhythm. Grossly normal heart sounds.  Good peripheral circulation. Respiratory: Normal respiratory effort.  No retractions. Lungs CTAB. Musculoskeletal: Right foot., Ankle: Tender along the  lateral malleoli and connecting ligaments. Tender along the distal fibula and tibia. Pain with range of motion of the ankle. 2+ DP pulse. Mild tenderness over the dorsal foot. No laxity noted on Lachman's. Neurologic:  Normal speech and language. No gross focal neurologic deficits are appreciated. No gait instability. Skin:  Skin is warm, dry and intact. No rash noted. Psychiatric: Mood and affect are normal. Speech and behavior are normal.  ____________________________________________   LABS (all labs ordered are listed, but only abnormal results are displayed)  Labs Reviewed - No data to display ____________________________________________  EKG   ____________________________________________  RADIOLOGY   CLINICAL DATA: Injury walking down stairs  EXAM: RIGHT ANKLE - COMPLETE 3+ VIEW  COMPARISON: None.  FINDINGS: Frontal, oblique, and lateral views were obtained. There is mild generalized soft tissue swelling. There is no demonstrable fracture or joint effusion. The ankle mortise appears intact. There is a small inferior calcaneal spur.  IMPRESSION: Mild soft tissue swelling. No fracture. Mortise intact. Inferior calcaneal spur present.   Electronically Signed  By: Bretta BangWilliam Woodruff III M.D.  On: 10/08/2015 19:41    ____________________________________________   PROCEDURES  Procedure(s) performed: None  Critical Care performed: No  ____________________________________________   INITIAL IMPRESSION / ASSESSMENT AND PLAN / ED COURSE  Pertinent labs & imaging results that were available during my care of the patient were reviewed by me and considered in my medical decision making (see chart for details).  45 year old with acute right ankle and foot pain after walking downstairs today. Negative x-rays. Suspect a sprain. She has follow-up with podiatry next week. Given a work note. ____________________________________________   FINAL CLINICAL  IMPRESSION(S) / ED DIAGNOSES  Final diagnoses:  Ankle sprain, right, initial encounter      Ignacia BayleyRobert Evalynn Hankins, PA-C 10/08/15 1956  Rockne MenghiniAnne-Caroline Norman, MD 10/08/15 2006

## 2015-10-08 NOTE — Discharge Instructions (Signed)
Acute Ankle Sprain With Phase I Rehab An acute ankle sprain is a partial or complete tear in one or more of the ligaments of the ankle due to traumatic injury. The severity of the injury depends on both the number of ligaments sprained and the grade of sprain. There are 3 grades of sprains.   A grade 1 sprain is a mild sprain. There is a slight pull without obvious tearing. There is no loss of strength, and the muscle and ligament are the correct length.  A grade 2 sprain is a moderate sprain. There is tearing of fibers within the substance of the ligament where it connects two bones or two cartilages. The length of the ligament is increased, and there is usually decreased strength.  A grade 3 sprain is a complete rupture of the ligament and is uncommon. In addition to the grade of sprain, there are three types of ankle sprains.  Lateral ankle sprains: This is a sprain of one or more of the three ligaments on the outer side (lateral) of the ankle. These are the most common sprains. Medial ankle sprains: There is one large triangular ligament of the inner side (medial) of the ankle that is susceptible to injury. Medial ankle sprains are less common. Syndesmosis, "high ankle," sprains: The syndesmosis is the ligament that connects the two bones of the lower leg. Syndesmosis sprains usually only occur with very severe ankle sprains. SYMPTOMS  Pain, tenderness, and swelling in the ankle, starting at the side of injury that may progress to the whole ankle and foot with time.  "Pop" or tearing sensation at the time of injury.  Bruising that may spread to the heel.  Impaired ability to walk soon after injury. CAUSES   Acute ankle sprains are caused by trauma placed on the ankle that temporarily forces or pries the anklebone (talus) out of its normal socket.  Stretching or tearing of the ligaments that normally hold the joint in place (usually due to a twisting injury). RISK INCREASES  WITH:  Previous ankle sprain.  Sports in which the foot may land awkwardly (i.e., basketball, volleyball, or soccer) or walking or running on uneven or rough surfaces.  Shoes with inadequate support to prevent sideways motion when stress occurs.  Poor strength and flexibility.  Poor balance skills.  Contact sports. PREVENTION   Warm up and stretch properly before activity.  Maintain physical fitness:  Ankle and leg flexibility, muscle strength, and endurance.  Cardiovascular fitness.  Balance training activities.  Use proper technique and have a coach correct improper technique.  Taping, protective strapping, bracing, or high-top tennis shoes may help prevent injury. Initially, tape is best; however, it loses most of its support function within 10 to 15 minutes.  Wear proper-fitted protective shoes (High-top shoes with taping or bracing is more effective than either alone).  Provide the ankle with support during sports and practice activities for 12 months following injury. PROGNOSIS   If treated properly, ankle sprains can be expected to recover completely; however, the length of recovery depends on the degree of injury.  A grade 1 sprain usually heals enough in 5 to 7 days to allow modified activity and requires an average of 6 weeks to heal completely.  A grade 2 sprain requires 6 to 10 weeks to heal completely.  A grade 3 sprain requires 12 to 16 weeks to heal.  A syndesmosis sprain often takes more than 3 months to heal. RELATED COMPLICATIONS   Frequent recurrence of symptoms may   result in a chronic problem. Appropriately addressing the problem the first time decreases the frequency of recurrence and optimizes healing time. Severity of the initial sprain does not predict the likelihood of later instability. °· Injury to other structures (bone, cartilage, or tendon). °· A chronically unstable or arthritic ankle joint is a possibility with repeated  sprains. °TREATMENT °Treatment initially involves the use of ice, medication, and compression bandages to help reduce pain and inflammation. Ankle sprains are usually immobilized in a walking cast or boot to allow for healing. Crutches may be recommended to reduce pressure on the injury. After immobilization, strengthening and stretching exercises may be necessary to regain strength and a full range of motion. Surgery is rarely needed to treat ankle sprains. °MEDICATION  °· Nonsteroidal anti-inflammatory medications, such as aspirin and ibuprofen (do not take for the first 3 days after injury or within 7 days before surgery), or other minor pain relievers, such as acetaminophen, are often recommended. Take these as directed by your caregiver. Contact your caregiver immediately if any bleeding, stomach upset, or signs of an allergic reaction occur from these medications. °· Ointments applied to the skin may be helpful. °· Pain relievers may be prescribed as necessary by your caregiver. Do not take prescription pain medication for longer than 4 to 7 days. Use only as directed and only as much as you need. °HEAT AND COLD °· Cold treatment (icing) is used to relieve pain and reduce inflammation for acute and chronic cases. Cold should be applied for 10 to 15 minutes every 2 to 3 hours for inflammation and pain and immediately after any activity that aggravates your symptoms. Use ice packs or an ice massage. °· Heat treatment may be used before performing stretching and strengthening activities prescribed by your caregiver. Use a heat pack or a warm soak. °SEEK IMMEDIATE MEDICAL CARE IF:  °· Pain, swelling, or bruising worsens despite treatment. °· You experience pain, numbness, discoloration, or coldness in the foot or toes. °· New, unexplained symptoms develop (drugs used in treatment may produce side effects.) °EXERCISES  °PHASE I EXERCISES °RANGE OF MOTION (ROM) AND STRETCHING EXERCISES - Ankle Sprain, Acute Phase I,  Weeks 1 to 2 °These exercises may help you when beginning to restore flexibility in your ankle. You will likely work on these exercises for the 1 to 2 weeks after your injury. Once your physician, physical therapist, or athletic trainer sees adequate progress, he or she will advance your exercises. While completing these exercises, remember:  °· Restoring tissue flexibility helps normal motion to return to the joints. This allows healthier, less painful movement and activity. °· An effective stretch should be held for at least 30 seconds. °· A stretch should never be painful. You should only feel a gentle lengthening or release in the stretched tissue. °RANGE OF MOTION - Dorsi/Plantar Flexion °· While sitting with your right / left knee straight, draw the top of your foot upwards by flexing your ankle. Then reverse the motion, pointing your toes downward. °· Hold each position for __________ seconds. °· After completing your first set of exercises, repeat this exercise with your knee bent. °Repeat __________ times. Complete this exercise __________ times per day.  °RANGE OF MOTION - Ankle Alphabet °· Imagine your right / left big toe is a pen. °· Keeping your hip and knee still, write out the entire alphabet with your "pen." Make the letters as large as you can without increasing any discomfort. °Repeat __________ times. Complete this exercise __________   times per day.  °STRENGTHENING EXERCISES - Ankle Sprain, Acute -Phase I, Weeks 1 to 2 °These exercises may help you when beginning to restore strength in your ankle. You will likely work on these exercises for 1 to 2 weeks after your injury. Once your physician, physical therapist, or athletic trainer sees adequate progress, he or she will advance your exercises. While completing these exercises, remember:  °· Muscles can gain both the endurance and the strength needed for everyday activities through controlled exercises. °· Complete these exercises as instructed by  your physician, physical therapist, or athletic trainer. Progress the resistance and repetitions only as guided. °· You may experience muscle soreness or fatigue, but the pain or discomfort you are trying to eliminate should never worsen during these exercises. If this pain does worsen, stop and make certain you are following the directions exactly. If the pain is still present after adjustments, discontinue the exercise until you can discuss the trouble with your clinician. °STRENGTH - Dorsiflexors °· Secure a rubber exercise band/tubing to a fixed object (i.e., table, pole) and loop the other end around your right / left foot. °· Sit on the floor facing the fixed object. The band/tubing should be slightly tense when your foot is relaxed. °· Slowly draw your foot back toward you using your ankle and toes. °· Hold this position for __________ seconds. Slowly release the tension in the band and return your foot to the starting position. °Repeat __________ times. Complete this exercise __________ times per day.  °STRENGTH - Plantar-flexors  °· Sit with your right / left leg extended. Holding onto both ends of a rubber exercise band/tubing, loop it around the ball of your foot. Keep a slight tension in the band. °· Slowly push your toes away from you, pointing them downward. °· Hold this position for __________ seconds. Return slowly, controlling the tension in the band/tubing. °Repeat __________ times. Complete this exercise __________ times per day.  °STRENGTH - Ankle Eversion °· Secure one end of a rubber exercise band/tubing to a fixed object (table, pole). Loop the other end around your foot just before your toes. °· Place your fists between your knees. This will focus your strengthening at your ankle. °· Drawing the band/tubing across your opposite foot, slowly, pull your little toe out and up. Make sure the band/tubing is positioned to resist the entire motion. °· Hold this position for __________ seconds. °Have  your muscles resist the band/tubing as it slowly pulls your foot back to the starting position.  °Repeat __________ times. Complete this exercise __________ times per day.  °STRENGTH - Ankle Inversion °· Secure one end of a rubber exercise band/tubing to a fixed object (table, pole). Loop the other end around your foot just before your toes. °· Place your fists between your knees. This will focus your strengthening at your ankle. °· Slowly, pull your big toe up and in, making sure the band/tubing is positioned to resist the entire motion. °· Hold this position for __________ seconds. °· Have your muscles resist the band/tubing as it slowly pulls your foot back to the starting position. °Repeat __________ times. Complete this exercises __________ times per day.  °STRENGTH - Towel Curls °· Sit in a chair positioned on a non-carpeted surface. °· Place your right / left foot on a towel, keeping your heel on the floor. °· Pull the towel toward your heel by only curling your toes. Keep your heel on the floor. °· If instructed by your physician, physical therapist,   or athletic trainer, add weight to the end of the towel. Repeat __________ times. Complete this exercise __________ times per day.   This information is not intended to replace advice given to you by your health care provider. Make sure you discuss any questions you have with your health care provider.   Document Released: 11/10/2004 Document Revised: 05/02/2014 Document Reviewed: 07/24/2008 Elsevier Interactive Patient Education 2016 [employer]   Take pain medicine as needed. Continue rest, ice. Follow-up with podiatry next week.

## 2015-12-07 ENCOUNTER — Encounter: Payer: Self-pay | Admitting: *Deleted

## 2015-12-07 ENCOUNTER — Emergency Department: Payer: Medicaid Other

## 2015-12-07 DIAGNOSIS — J45909 Unspecified asthma, uncomplicated: Secondary | ICD-10-CM | POA: Insufficient documentation

## 2015-12-07 DIAGNOSIS — R0789 Other chest pain: Secondary | ICD-10-CM | POA: Insufficient documentation

## 2015-12-07 DIAGNOSIS — I509 Heart failure, unspecified: Secondary | ICD-10-CM | POA: Insufficient documentation

## 2015-12-07 DIAGNOSIS — E119 Type 2 diabetes mellitus without complications: Secondary | ICD-10-CM | POA: Insufficient documentation

## 2015-12-07 DIAGNOSIS — Z79899 Other long term (current) drug therapy: Secondary | ICD-10-CM | POA: Insufficient documentation

## 2015-12-07 DIAGNOSIS — I110 Hypertensive heart disease with heart failure: Secondary | ICD-10-CM | POA: Insufficient documentation

## 2015-12-07 DIAGNOSIS — F17210 Nicotine dependence, cigarettes, uncomplicated: Secondary | ICD-10-CM | POA: Insufficient documentation

## 2015-12-07 DIAGNOSIS — F430 Acute stress reaction: Secondary | ICD-10-CM | POA: Diagnosis not present

## 2015-12-07 LAB — CBC
HCT: 41.3 % (ref 35.0–47.0)
HEMOGLOBIN: 14.4 g/dL (ref 12.0–16.0)
MCH: 32.4 pg (ref 26.0–34.0)
MCHC: 34.7 g/dL (ref 32.0–36.0)
MCV: 93.3 fL (ref 80.0–100.0)
Platelets: 189 10*3/uL (ref 150–440)
RBC: 4.43 MIL/uL (ref 3.80–5.20)
RDW: 13.1 % (ref 11.5–14.5)
WBC: 8.4 10*3/uL (ref 3.6–11.0)

## 2015-12-07 LAB — BASIC METABOLIC PANEL
ANION GAP: 3 — AB (ref 5–15)
BUN: 14 mg/dL (ref 6–20)
CHLORIDE: 108 mmol/L (ref 101–111)
CO2: 26 mmol/L (ref 22–32)
Calcium: 9 mg/dL (ref 8.9–10.3)
Creatinine, Ser: 0.47 mg/dL (ref 0.44–1.00)
GFR calc Af Amer: >60 mL/min (ref >60)
GFR calc non Af Amer: >60 mL/min (ref >60)
GLUCOSE: 114 mg/dL — AB (ref 65–99)
POTASSIUM: 3.7 mmol/L (ref 3.5–5.1)
Sodium: 137 mmol/L (ref 135–145)

## 2015-12-07 LAB — TROPONIN I: Troponin I: <0.03 ng/mL (ref <0.03)
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
## 2015-12-08 ENCOUNTER — Emergency Department
Admission: EM | Admit: 2015-12-08 | Discharge: 2015-12-08 | Disposition: A | Discharge status: Home / Self Care | Payer: Medicaid Other | Attending: Emergency Medicine | Admitting: Emergency Medicine

## 2015-12-08 DIAGNOSIS — R0789 Other chest pain: Secondary | ICD-10-CM

## 2015-12-08 DIAGNOSIS — F439 Reaction to severe stress, unspecified: Secondary | ICD-10-CM

## 2015-12-08 NOTE — ED Notes (Signed)
Pt. Reports that talking to the security guard out front helped her chest pain ease. Pt. Says now she has been here a long time and thinks she is fine she wants a blanket and watch tv and go home soon.

## 2015-12-08 NOTE — ED Notes (Signed)
Pt. Verbalizes understanding of d/c instructions, and follow-up. VS stable and pain controlled per pt.  Pt. In NAD at time of d/c and denies further concerns regarding this visit. Pt. Ambulatory Out of the unit with steady gait. Pt advised to return to the ED at any time for emergent concerns, or for new/worsening symptoms.
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
## 2015-12-08 NOTE — Discharge Instructions (Signed)
Please seek medical attention for any high fevers, chest pain, shortness of breath, change in behavior, persistent vomiting, bloody stool or any other new or concerning symptoms.  

## 2015-12-08 NOTE — ED Notes (Signed)
Pt. Talking on the phone during assessment and unwilling to provide further details or answer further questions for this RN. Will continue to attempt further assessment.

## 2016-03-03 ENCOUNTER — Emergency Department
Admission: EM | Admit: 2016-03-03 | Discharge: 2016-03-03 | Disposition: A | Discharge status: Home / Self Care | Payer: Medicaid Other | Attending: Emergency Medicine | Admitting: Emergency Medicine

## 2016-03-03 ENCOUNTER — Encounter: Payer: Self-pay | Admitting: Medical Oncology

## 2016-03-03 DIAGNOSIS — M19071 Primary osteoarthritis, right ankle and foot: Secondary | ICD-10-CM | POA: Diagnosis not present

## 2016-03-03 DIAGNOSIS — E1140 Type 2 diabetes mellitus with diabetic neuropathy, unspecified: Secondary | ICD-10-CM | POA: Insufficient documentation

## 2016-03-03 DIAGNOSIS — G629 Polyneuropathy, unspecified: Secondary | ICD-10-CM | POA: Diagnosis not present

## 2016-03-03 DIAGNOSIS — Z792 Long term (current) use of antibiotics: Secondary | ICD-10-CM | POA: Diagnosis not present

## 2016-03-03 DIAGNOSIS — Z79899 Other long term (current) drug therapy: Secondary | ICD-10-CM | POA: Insufficient documentation

## 2016-03-03 DIAGNOSIS — M79671 Pain in right foot: Secondary | ICD-10-CM

## 2016-03-03 DIAGNOSIS — Z91040 Latex allergy status: Secondary | ICD-10-CM | POA: Insufficient documentation

## 2016-03-03 DIAGNOSIS — G5791 Unspecified mononeuropathy of right lower limb: Secondary | ICD-10-CM

## 2016-03-03 DIAGNOSIS — I110 Hypertensive heart disease with heart failure: Secondary | ICD-10-CM | POA: Diagnosis not present

## 2016-03-03 DIAGNOSIS — G8929 Other chronic pain: Secondary | ICD-10-CM | POA: Insufficient documentation

## 2016-03-03 DIAGNOSIS — F17210 Nicotine dependence, cigarettes, uncomplicated: Secondary | ICD-10-CM | POA: Insufficient documentation

## 2016-03-03 DIAGNOSIS — I509 Heart failure, unspecified: Secondary | ICD-10-CM | POA: Insufficient documentation

## 2016-03-03 DIAGNOSIS — Z791 Long term (current) use of non-steroidal anti-inflammatories (NSAID): Secondary | ICD-10-CM | POA: Diagnosis not present

## 2016-03-03 DIAGNOSIS — J45909 Unspecified asthma, uncomplicated: Secondary | ICD-10-CM | POA: Diagnosis not present

## 2016-03-03 MED ORDER — LIDOCAINE 5 % EX PTCH: 1.0000 | MEDICATED_PATCH | Freq: Two times a day (BID) | CUTANEOUS | 0 refills | Status: AC

## 2016-03-03 NOTE — ED Notes (Signed)
Patient presents to the ED with right foot pain.  Patient states she has been taking diclofenac that her doctor prescribed but it has not improved.  Patient states pain has been increased x 1.5 months.  Patient sees a podiatrist regularly and has been diagnosed with arthritis.  Patient states she is having difficulty sleeping at night due to pain.

## 2016-03-03 NOTE — Discharge Instructions (Signed)
You should continue to dose the prescription meds previously prescribed by Dr. Alberteen Spindleline. Follow-up with Dr. Welton FlakesKhan for further testing or neurology referral. Try the pain patches for pain relief. Consider using warm, moist compreses for pain as well.

## 2016-03-03 NOTE — ED Provider Notes (Signed)
[facility] Emergency Department Provider Note ____________________________________________  Time seen: [id number]  I have reviewed the triage vital signs and the nursing notes.  HISTORY  Chief Complaint  Foot Pain  HPI Desiree Cruz is a 45 y.o. female presents to the ED for evaluation of chronic right foot pain. She's been evaluated by her primary care provider, podiatry, and in the past by neurology. She has a history about diabetic neuropathy, as well as arthritis to the foot. She is been treated with diclofenac, Neurontin, andUltram at times. Patient denies any recent injury, accident, trauma. She notices pain to the dorsal aspect of the foot as well as some intermittent swelling. She was most recently seen by her podiatrist in September, and referred back to the primary care provider for neurology referral.  Past Medical History:  Diagnosis Date  . Asthma   . CHF (congestive heart failure) (HCC)   . Diabetes mellitus without complication (HCC)   . Hypertension   . Migraines     There are no active problems to display for this patient.   Past Surgical History:  Procedure Laterality Date  . ABDOMINAL HYSTERECTOMY      Prior to Admission medications   Medication Sig Start Date End Date Taking? Authorizing Provider  diazepam (VALIUM) 5 MG tablet Take 1 tablet (5 mg total) by mouth every 8 (eight) hours as needed for anxiety. 07/10/15   Emily FilbertJonathan E Williams, MD  FLUoxetine (PROZAC) 20 MG capsule Take 20 mg by mouth daily.    Historical Provider, MD  gabapentin (NEURONTIN) 300 MG capsule Take 300 mg by mouth 2 (two) times daily.    Historical Provider, MD  guaiFENesin (MUCINEX) 600 MG 12 hr tablet Take 1 tablet (600 mg total) by mouth 2 (two) times daily. 07/14/15 07/13/16  Jene Everyobert Kinner, MD  guaiFENesin-codeine 100-10 MG/5ML syrup Take 5 mLs by mouth every 6 (six) hours as needed for cough. 07/19/15   Minna AntisKevin Paduchowski, MD  HYDROcodone-acetaminophen  (NORCO/VICODIN) 5-325 MG tablet Take 1 tablet by mouth every 4 (four) hours as needed for moderate pain. 03/18/15   Tommi Rumpshonda L Summers, PA-C  lidocaine (LIDODERM) 5 % Place 1 patch onto the skin every 12 (twelve) hours. Remove & Discard patch within 12 hours or as directed by MD 03/03/16 03/03/17  Charlesetta IvoryJenise V Bacon Uva Runkel, PA-C  metoprolol succinate (TOPROL-XL) 25 MG 24 hr tablet Take 25 mg by mouth daily.    Historical Provider, MD  mirtazapine (REMERON) 30 MG tablet Take 30 mg by mouth at bedtime.    Historical Provider, MD  moxifloxacin (VIGAMOX) 0.5 % ophthalmic solution Place 1 drop into the right eye 3 (three) times daily. For 5 days 11/02/14   Payton Mccallumrlando Conty, MD  ondansetron (ZOFRAN) 4 MG tablet Take 1 tablet (4 mg total) by mouth daily as needed for nausea or vomiting. 07/14/15   Jene Everyobert Kinner, MD  traMADol (ULTRAM) 50 MG tablet Take 1 tablet (50 mg total) by mouth every 6 (six) hours as needed. 10/08/15 10/07/16  Ignacia Bayleyobert Tumey, PA-C    Allergies Latex and Penicillins  Family History  Problem Relation Age of Onset  . Breast cancer Neg Hx     Social History Social History  Substance Use Topics  . Smoking status: Current Every Day Smoker    Packs/day: 0.50    Types: Cigarettes  . Smokeless tobacco: Never Used  . Alcohol use No    Review of Systems  Constitutional: Negative for fever. Cardiovascular: Negative for chest pain. Respiratory: Negative  for shortness of breath. Musculoskeletal: Negative for back pain. Right foot pain as above. Skin: Negative for rash. Neurological: Negative for headaches, focal weakness or numbness. ____________________________________________  PHYSICAL EXAM:  VITAL SIGNS: ED Triage Vitals [03/03/16 1801]  Enc Vitals Group     BP 106/73     Pulse Rate 78     Resp 18     Temp 98.2 F (36.8 C)     Temp Source Oral     SpO2 97 %     Weight 236 lb (107 kg)     Height 5\' 9"  (1.753 m)     Head Circumference      Peak Flow      Pain Score 10     Pain  Loc      Pain Edu?      Excl. in GC?     Constitutional: Alert and oriented. Well appearing and in no distress. Head: Normocephalic and atraumatic. Cardiovascular:  Normal distal pulses. Respiratory: Normal respiratory effort.  Musculoskeletal: Right foot without obvious deformity, dislocation, effusion, or edema. Patient with normal ankle range of motion on exam. No significant calf or Achilles tenderness is noted. Nontender with normal range of motion in all extremities.  Neurologic:  Normal gait without ataxia. Normal speech and language. No gross focal neurologic deficits are appreciated. Skin:  Skin is warm, dry and intact. No rash noted. ____________________________________________   RADIOLOGY  Right Foot (07/2015)  No acute bony or joint abnormality is identified. Plantar calcaneal spur is noted. Soft tissues are unremarkable.  I, Tiegan Jambor, Charlesetta IvoryJenise V Bacon, personally viewed and evaluated these images (plain radiographs) as part of my medical decision making, as well as reviewing the written report by the radiologist. ____________________________________________  PROCEDURES  Ace wrap ____________________________________________  INITIAL IMPRESSION / ASSESSMENT AND PLAN / ED COURSE  Patient with chronic right foot pain secondary to underlying diabetic neuropathy and some mild arthritis. She is discharged with a prescription for Lidoderm patches to dose as directed. She will follow with her PCP for plan referral to neurology as necessary. Otherwise she will continue follow-up with her podiatrist and dose her PCP prescribed medicines as directed.  Clinical Course    ____________________________________________  FINAL CLINICAL IMPRESSION(S) / ED DIAGNOSES  Final diagnoses:  Foot pain, right  Neuropathy of foot, right  Arthritis of foot, right      Lissa HoardJenise V Bacon Nuvia Hileman, PA-C 03/03/16 1952    Jennye MoccasinBrian S Quigley, MD 03/03/16 2326

## 2016-03-03 NOTE — ED Triage Notes (Signed)
Pt reports that she has been having rt foot pain for over a month, she has seen pcp for pain and told it was arthritis but pain has continued

## 2016-05-06 ENCOUNTER — Emergency Department: Payer: Medicaid Other

## 2016-05-06 ENCOUNTER — Emergency Department
Admission: EM | Admit: 2016-05-06 | Discharge: 2016-05-06 | Disposition: A | Discharge status: Home / Self Care | Payer: Medicaid Other | Attending: Emergency Medicine | Admitting: Emergency Medicine

## 2016-05-06 ENCOUNTER — Encounter: Payer: Self-pay | Admitting: Emergency Medicine

## 2016-05-06 DIAGNOSIS — I110 Hypertensive heart disease with heart failure: Secondary | ICD-10-CM | POA: Diagnosis not present

## 2016-05-06 DIAGNOSIS — R05 Cough: Secondary | ICD-10-CM | POA: Diagnosis present

## 2016-05-06 DIAGNOSIS — Z91040 Latex allergy status: Secondary | ICD-10-CM | POA: Diagnosis not present

## 2016-05-06 DIAGNOSIS — I509 Heart failure, unspecified: Secondary | ICD-10-CM | POA: Insufficient documentation

## 2016-05-06 DIAGNOSIS — J45909 Unspecified asthma, uncomplicated: Secondary | ICD-10-CM | POA: Insufficient documentation

## 2016-05-06 DIAGNOSIS — Z79899 Other long term (current) drug therapy: Secondary | ICD-10-CM | POA: Insufficient documentation

## 2016-05-06 DIAGNOSIS — F17210 Nicotine dependence, cigarettes, uncomplicated: Secondary | ICD-10-CM | POA: Insufficient documentation

## 2016-05-06 DIAGNOSIS — J209 Acute bronchitis, unspecified: Secondary | ICD-10-CM | POA: Insufficient documentation

## 2016-05-06 DIAGNOSIS — E119 Type 2 diabetes mellitus without complications: Secondary | ICD-10-CM | POA: Diagnosis not present

## 2016-05-06 MED ORDER — AZITHROMYCIN 250 MG PO TABS: ORAL_TABLET | ORAL | 0 refills | Status: DC

## 2016-05-06 MED ORDER — PROMETHAZINE-DM 6.25-15 MG/5ML PO SYRP: 5.0000 mL | ORAL_SOLUTION | Freq: Four times a day (QID) | ORAL | 0 refills | Status: DC | PRN

## 2016-05-06 MED ORDER — FLUTICASONE PROPIONATE 50 MCG/ACT NA SUSP: 2.0000 | Freq: Every day | NASAL | 0 refills | Status: DC

## 2016-05-06 NOTE — ED Provider Notes (Signed)
[facility] Emergency Department Provider Note  ____________________________________________  Time seen: Approximately 10:36 AM  I have reviewed the triage vital signs and the nursing notes.   HISTORY  Chief Complaint Cough    HPI Desiree Cruz is a 46 y.o. female , NAD, presents to the emergency department with 1 week history of cough, chest congestion and body aches. Patient states that over the last week she has had nasal congestion, runny nose, postnasal drainage, cough and chest congestion. Has had occasional body aches off and on but denies such today. States that her chest congestion seems to be worsening rather than improving. Denies any wheezing, abdominal pain, nausea or vomiting. Has had no diarrhea. Denies fevers or chills. States she was exposed to a family member last week who had similar symptoms.   Past Medical History:  Diagnosis Date  . Asthma   . CHF (congestive heart failure) (HCC)   . Diabetes mellitus without complication (HCC)   . Hypertension   . Migraines     There are no active problems to display for this patient.   Past Surgical History:  Procedure Laterality Date  . ABDOMINAL HYSTERECTOMY      Prior to Admission medications   Medication Sig Start Date End Date Taking? Authorizing Provider  azithromycin (ZITHROMAX Z-PAK) 250 MG tablet Take 2 tablets (500 mg) on  Day 1,  followed by 1 tablet (250 mg) once daily on Days 2 through 5. 05/06/16   Jami L Hagler, PA-C  diazepam (VALIUM) 5 MG tablet Take 1 tablet (5 mg total) by mouth every 8 (eight) hours as needed for anxiety. 07/10/15   Emily Filbert, MD  FLUoxetine (PROZAC) 20 MG capsule Take 20 mg by mouth daily.    Historical Provider, MD  fluticasone (FLONASE) 50 MCG/ACT nasal spray Place 2 sprays into both nostrils daily. 05/06/16   Jami L Hagler, PA-C  gabapentin (NEURONTIN) 300 MG capsule Take 300 mg by mouth 2 (two) times daily.    Historical Provider, MD   guaiFENesin (MUCINEX) 600 MG 12 hr tablet Take 1 tablet (600 mg total) by mouth 2 (two) times daily. 07/14/15 07/13/16  Jene Every, MD  guaiFENesin-codeine 100-10 MG/5ML syrup Take 5 mLs by mouth every 6 (six) hours as needed for cough. 07/19/15   Minna Antis, MD  HYDROcodone-acetaminophen (NORCO/VICODIN) 5-325 MG tablet Take 1 tablet by mouth every 4 (four) hours as needed for moderate pain. 03/18/15   Tommi Rumps, PA-C  lidocaine (LIDODERM) 5 % Place 1 patch onto the skin every 12 (twelve) hours. Remove & Discard patch within 12 hours or as directed by MD 03/03/16 03/03/17  Charlesetta Ivory Menshew, PA-C  metoprolol succinate (TOPROL-XL) 25 MG 24 hr tablet Take 25 mg by mouth daily.    Historical Provider, MD  mirtazapine (REMERON) 30 MG tablet Take 30 mg by mouth at bedtime.    Historical Provider, MD  moxifloxacin (VIGAMOX) 0.5 % ophthalmic solution Place 1 drop into the right eye 3 (three) times daily. For 5 days 11/02/14   Payton Mccallum, MD  ondansetron (ZOFRAN) 4 MG tablet Take 1 tablet (4 mg total) by mouth daily as needed for nausea or vomiting. 07/14/15   Jene Every, MD  promethazine-dextromethorphan (PROMETHAZINE-DM) 6.25-15 MG/5ML syrup Take 5 mLs by mouth 4 (four) times daily as needed for cough. 05/06/16   Jami L Hagler, PA-C  traMADol (ULTRAM) 50 MG tablet Take 1 tablet (50 mg total) by mouth every 6 (six) hours as needed.  10/08/15 10/07/16  Ignacia Bayleyobert Tumey, PA-C    Allergies Latex and Penicillins  Family History  Problem Relation Age of Onset  . Breast cancer Neg Hx     Social History Social History  Substance Use Topics  . Smoking status: Current Every Day Smoker    Packs/day: 0.50    Types: Cigarettes  . Smokeless tobacco: Never Used  . Alcohol use No     Review of Systems  Constitutional: No fever/chills ENT: Positive nasal congestion, runny nose, postnasal drainage. No sinus pressure, ear pain. Cardiovascular: No chest pain. Respiratory: Positive cough,  chest congestion. No shortness of breath. No wheezing.  Gastrointestinal: No abdominal pain.  No nausea, vomiting.  No diarrhea. Musculoskeletal: Positive for intermittent general myalgias.  Skin: Negative for rash. Neurological: Negative for headaches, focal weakness or numbness. 10-point ROS otherwise negative.  ____________________________________________   PHYSICAL EXAM:  VITAL SIGNS: ED Triage Vitals  Enc Vitals Group     BP 05/06/16 0857 (!) 127/98     Pulse Rate 05/06/16 0857 84     Resp 05/06/16 0857 20     Temp 05/06/16 0857 98.5 F (36.9 C)     Temp Source 05/06/16 0857 Oral     SpO2 05/06/16 0857 97 %     Weight 05/06/16 0858 238 lb (108 kg)     Height 05/06/16 0858 5\' 9"  (1.753 m)     Head Circumference --      Peak Flow --      Pain Score 05/06/16 0858 6     Pain Loc --      Pain Edu? --      Excl. in GC? --      Constitutional: Alert and oriented. Well appearing and in no acute distress. Eyes: Conjunctivae are normal. Head: Atraumatic. ENT:      Ears: TMs visualized bilaterally without erythema, effusion, bulging or perforation.      Nose: Mild congestion with clear rhinorrhea. Bilateral turbinates are injected and edematous.      Mouth/Throat: Mucous membranes are moist. Pharynx without erythema, swelling, exudate. Uvula is midline. Airways pain. White postnasal drip. Neck: No stridor. Supple with full range of motion. Hematological/Lymphatic/Immunilogical: No cervical lymphadenopathy. Cardiovascular: Normal rate, regular rhythm. Normal S1 and S2.  Good peripheral circulation. Respiratory: Normal respiratory effort without tachypnea or retractions. Lungs CTAB with breath sounds noted in all lung fields. No wheeze, rhonchi, rales. Musculoskeletal: No lower extremity tenderness nor edema.  No joint effusions. Neurologic:  Normal speech and language. No gross focal neurologic deficits are appreciated.  Skin:  Skin is warm, dry and intact. No rash  noted. Psychiatric: Mood and affect are normal. Speech and behavior are normal. Patient exhibits appropriate insight and judgement.   ____________________________________________   LABS  None ____________________________________________  EKG  None ____________________________________________  RADIOLOGY I, Hope PigeonJami L Hagler, personally viewed and evaluated these images (plain radiographs) as part of my medical decision making, as well as reviewing the written report by the radiologist.  Dg Chest 2 View  Result Date: 05/06/2016 CLINICAL DATA:  Cough, congestion, bodyaches with SOB for about 5 days or so now. Smoker. Hx of asthma, CHF, Diabetes and HTN EXAM: CHEST  2 VIEW COMPARISON:  12/07/2015 FINDINGS: Normal heart, mediastinum and hila. Lungs are clear.  No pleural effusion.  No pneumothorax. Skeletal structures are unremarkable. IMPRESSION: No active cardiopulmonary disease. Electronically Signed   By: Amie Portlandavid  Ormond M.D.   On: 05/06/2016 11:01    ____________________________________________    PROCEDURES  Procedure(s)  performed: None   Procedures   Medications - No data to display   ____________________________________________   INITIAL IMPRESSION / ASSESSMENT AND PLAN / ED COURSE  Pertinent labs & imaging results that were available during my care of the patient were reviewed by me and considered in my medical decision making (see chart for details).  Clinical Course     Patient's diagnosis is consistent with Acute bronchitis. Patient will be discharged home with prescriptions for azithromycin, Flonase and promethazine DM to take as directed. Patient is to follow up with her primary care provider if symptoms persist past this treatment course. Patient is given ED precautions to return to the ED for any worsening or new symptoms.    ____________________________________________  FINAL CLINICAL IMPRESSION(S) / ED DIAGNOSES  Final diagnoses:  Acute bronchitis,  unspecified organism      NEW MEDICATIONS STARTED DURING THIS VISIT:  Discharge Medication List as of 05/06/2016 11:15 AM    START taking these medications   Details  azithromycin (ZITHROMAX Z-PAK) 250 MG tablet Take 2 tablets (500 mg) on  Day 1,  followed by 1 tablet (250 mg) once daily on Days 2 through 5., Print    fluticasone (FLONASE) 50 MCG/ACT nasal spray Place 2 sprays into both nostrils daily., Starting Fri 05/06/2016, Print    promethazine-dextromethorphan (PROMETHAZINE-DM) 6.25-15 MG/5ML syrup Take 5 mLs by mouth 4 (four) times daily as needed for cough., Starting Fri 05/06/2016, Print             Ernestene Kiel Colwich, PA-C 05/06/16 1851    Emily Filbert, MD 05/07/16 1501

## 2016-05-06 NOTE — ED Triage Notes (Signed)
Cough, congestion, bodyaches.  Mask applied.

## 2016-06-21 ENCOUNTER — Encounter: Payer: Self-pay | Admitting: *Deleted

## 2016-06-21 ENCOUNTER — Emergency Department
Admission: EM | Admit: 2016-06-21 | Discharge: 2016-06-21 | Disposition: A | Discharge status: Home / Self Care | Payer: Medicaid Other | Attending: Student in an Organized Health Care Education/Training Program | Admitting: Student in an Organized Health Care Education/Training Program

## 2016-06-21 ENCOUNTER — Emergency Department: Payer: Medicaid Other

## 2016-06-21 DIAGNOSIS — E119 Type 2 diabetes mellitus without complications: Secondary | ICD-10-CM | POA: Insufficient documentation

## 2016-06-21 DIAGNOSIS — I509 Heart failure, unspecified: Secondary | ICD-10-CM | POA: Insufficient documentation

## 2016-06-21 DIAGNOSIS — W208XXA Other cause of strike by thrown, projected or falling object, initial encounter: Secondary | ICD-10-CM | POA: Insufficient documentation

## 2016-06-21 DIAGNOSIS — Y999 Unspecified external cause status: Secondary | ICD-10-CM | POA: Diagnosis not present

## 2016-06-21 DIAGNOSIS — Y929 Unspecified place or not applicable: Secondary | ICD-10-CM | POA: Diagnosis not present

## 2016-06-21 DIAGNOSIS — J45909 Unspecified asthma, uncomplicated: Secondary | ICD-10-CM | POA: Diagnosis not present

## 2016-06-21 DIAGNOSIS — Y939 Activity, unspecified: Secondary | ICD-10-CM | POA: Diagnosis not present

## 2016-06-21 DIAGNOSIS — I110 Hypertensive heart disease with heart failure: Secondary | ICD-10-CM | POA: Insufficient documentation

## 2016-06-21 DIAGNOSIS — Z79899 Other long term (current) drug therapy: Secondary | ICD-10-CM | POA: Insufficient documentation

## 2016-06-21 DIAGNOSIS — S9031XA Contusion of right foot, initial encounter: Secondary | ICD-10-CM | POA: Insufficient documentation

## 2016-06-21 DIAGNOSIS — S99921A Unspecified injury of right foot, initial encounter: Secondary | ICD-10-CM | POA: Diagnosis present

## 2016-06-21 DIAGNOSIS — F17210 Nicotine dependence, cigarettes, uncomplicated: Secondary | ICD-10-CM | POA: Diagnosis not present

## 2016-06-21 MED ORDER — KETOROLAC TROMETHAMINE 30 MG/ML IJ SOLN
30.0000 mg | Freq: Once | INTRAMUSCULAR | Status: AC
  Administered 2016-06-21: 30 mg via INTRAMUSCULAR
  Filled 2016-06-21: qty 1

## 2016-06-21 NOTE — ED Notes (Signed)
Gave pt walker.

## 2016-06-21 NOTE — ED Triage Notes (Signed)
States she dropped a brick on her right foot today and now has right foot pain

## 2016-06-21 NOTE — ED Provider Notes (Signed)
[facility]-EMERGENCY DEPARTMENT Provider Note   CSN: [id number] Arrival date & time: 06/21/16  1704     History   Chief Complaint Chief Complaint  Patient presents with  . Foot Pain    HPI Desiree Cruz is a 46 y.o. female presents to the emergency department for evaluation right foot pain. Earlier today, patient dropped a concrete block onto her right foot. She developed pain and swelling. She is unable to really came up with a moderate limp. No numbness or tingling. She has been taking Tylenol and ibuprofen with minimal to no improvement. She has applied ice with improvement. No other injuries to her body.  HPI  Past Medical History:  Diagnosis Date  . Asthma   . CHF (congestive heart failure) (HCC)   . Diabetes mellitus without complication (HCC)   . Hypertension   . Migraines     There are no active problems to display for this patient.   Past Surgical History:  Procedure Laterality Date  . ABDOMINAL HYSTERECTOMY      OB History    No data available       Home Medications    Prior to Admission medications   Medication Sig Start Date End Date Taking? Authorizing Provider  azithromycin (ZITHROMAX Z-PAK) 250 MG tablet Take 2 tablets (500 mg) on  Day 1,  followed by 1 tablet (250 mg) once daily on Days 2 through 5. 05/06/16   Jami L Hagler, PA-C  diazepam (VALIUM) 5 MG tablet Take 1 tablet (5 mg total) by mouth every 8 (eight) hours as needed for anxiety. 07/10/15   Emily Filbert, MD  FLUoxetine (PROZAC) 20 MG capsule Take 20 mg by mouth daily.    Historical Provider, MD  fluticasone (FLONASE) 50 MCG/ACT nasal spray Place 2 sprays into both nostrils daily. 05/06/16   Jami L Hagler, PA-C  gabapentin (NEURONTIN) 300 MG capsule Take 300 mg by mouth 2 (two) times daily.    Historical Provider, MD  guaiFENesin (MUCINEX) 600 MG 12 hr tablet Take 1 tablet (600 mg total) by mouth 2 (two) times daily. 07/14/15 07/13/16  Jene Every, MD  guaiFENesin-codeine 100-10 MG/5ML  syrup Take 5 mLs by mouth every 6 (six) hours as needed for cough. 07/19/15   Minna Antis, MD  HYDROcodone-acetaminophen (NORCO/VICODIN) 5-325 MG tablet Take 1 tablet by mouth every 4 (four) hours as needed for moderate pain. 03/18/15   Tommi Rumps, PA-C  lidocaine (LIDODERM) 5 % Place 1 patch onto the skin every 12 (twelve) hours. Remove & Discard patch within 12 hours or as directed by MD 03/03/16 03/03/17  Charlesetta Ivory Menshew, PA-C  metoprolol succinate (TOPROL-XL) 25 MG 24 hr tablet Take 25 mg by mouth daily.    Historical Provider, MD  mirtazapine (REMERON) 30 MG tablet Take 30 mg by mouth at bedtime.    Historical Provider, MD  moxifloxacin (VIGAMOX) 0.5 % ophthalmic solution Place 1 drop into the right eye 3 (three) times daily. For 5 days 11/02/14   Payton Mccallum, MD  ondansetron (ZOFRAN) 4 MG tablet Take 1 tablet (4 mg total) by mouth daily as needed for nausea or vomiting. 07/14/15   Jene Every, MD  promethazine-dextromethorphan (PROMETHAZINE-DM) 6.25-15 MG/5ML syrup Take 5 mLs by mouth 4 (four) times daily as needed for cough. 05/06/16   Jami L Hagler, PA-C  traMADol (ULTRAM) 50 MG tablet Take 1 tablet (50 mg total) by mouth every 6 (six) hours as needed. 10/08/15 10/07/16  Ignacia Bayley, PA-C  Family History Family History  Problem Relation Age of Onset  . Breast cancer Neg Hx     Social History Social History  Substance Use Topics  . Smoking status: Current Every Day Smoker    Packs/day: 0.50    Types: Cigarettes  . Smokeless tobacco: Never Used  . Alcohol use No     Allergies   Latex and Penicillins   Review of Systems Review of Systems  Constitutional: Negative for activity change, chills, fatigue and fever.  HENT: Negative for congestion, sinus pressure and sore throat.   Eyes: Negative for visual disturbance.  Respiratory: Negative for cough, chest tightness and shortness of breath.   Cardiovascular: Negative for chest pain and leg swelling.    Gastrointestinal: Negative for abdominal pain, diarrhea, nausea and vomiting.  Genitourinary: Negative for dysuria.  Musculoskeletal: Positive for arthralgias. Negative for gait problem and myalgias.  Skin: Negative for rash.  Neurological: Negative for weakness, numbness and headaches.  Hematological: Negative for adenopathy.  Psychiatric/Behavioral: Negative for agitation, behavioral problems and confusion.     Physical Exam Updated Vital Signs BP 117/75 (BP Location: Right Arm)   Pulse 81   Temp 98.7 F (37.1 C) (Oral)   Resp 18   Ht 5\' 9"  (1.753 m)   Wt 108.9 kg   SpO2 95%   BMI 35.44 kg/m   Physical Exam  Constitutional: She appears well-developed and well-nourished. No distress.  HENT:  Head: Normocephalic and atraumatic.  Eyes: Conjunctivae are normal.  Neck: Neck supple.  Cardiovascular: Normal rate and regular rhythm.   No murmur heard. Pulmonary/Chest: Effort normal. No respiratory distress.  Musculoskeletal: She exhibits no edema.  Emanation of the right foot shows no significant swelling. There is no warmth erythema. No skin breakdown noted. 2+ dorsalis pedis pulse. She has good ankle plantarflexion dorsiflexion. She is nontender along the medial or lateral malleolus.  Neurological: She is alert.  Skin: Skin is warm and dry.  Psychiatric: She has a normal mood and affect.  Nursing note and vitals reviewed.    ED Treatments / Results  Labs (all labs ordered are listed, but only abnormal results are displayed) Labs Reviewed - No data to display  EKG  EKG Interpretation None       Radiology Dg Foot Complete Right  Result Date: 06/21/2016 CLINICAL DATA:  Right foot pain after injury, dropped a brick on foot today. EXAM: RIGHT FOOT COMPLETE - 3+ VIEW COMPARISON:  Radiograph 08/13/2015 FINDINGS: There is no evidence of fracture or dislocation. Mild degenerative change in the midfoot. Again seen plantar calcaneal spur. Soft tissues are unremarkable.  IMPRESSION: No acute fracture or subluxation of the right foot. Electronically Signed   By: Rubye OaksMelanie  Ehinger M.D.   On: 06/21/2016 18:23    Procedures Procedures (including critical care time) SPLINT APPLICATION Date/Time: 7:13 PM Authorized by: Patience MuscaGAINES, Merrily Tegeler CHRISTOPHER Consent: Verbal consent obtained. Risks and benefits: risks, benefits and alternatives were discussed Consent given by: patient Splint applied by: *ED tech Location details: Right foot  Splint type: Postop shoe  Supplies used: Postop shoe  Post-procedure: The splinted body part was neurovascularly unchanged following the procedure. Patient tolerance: Patient tolerated the procedure well with no immediate complications.     Medications Ordered in ED Medications  ketorolac (TORADOL) 30 MG/ML injection 30 mg (30 mg Intramuscular Given 06/21/16 1802)     Initial Impression / Assessment and Plan / ED Course  I have reviewed the triage vital signs and the nursing notes.  Pertinent labs &  imaging results that were available during my care of the patient were reviewed by me and considered in my medical decision making (see chart for details).    46 year old female with right foot contusion. X-rays negative for any acute bony abnormality. She is placed into a postop shoe, given a walker to help with ambulation. She'll rest ice and elevate. Toradol 30 mg IM given to help alleviate severe pain. Pain improved. She will continue with Tylenol and ibuprofen.  Final Clinical Impressions(s) / ED Diagnoses   Final diagnoses:  Contusion of right foot, initial encounter    New Prescriptions New Prescriptions   No medications on file     Evon Slack, PA-C 06/21/16 1913    Willy Eddy, MD 06/21/16 2244

## 2016-06-21 NOTE — ED Notes (Signed)
Top of right sore to touch. Unable to feel for pedal pulses due to pain. +1 dp pulse. Pain 10+/10

## 2016-07-02 ENCOUNTER — Ambulatory Visit
Admission: EM | Admit: 2016-07-02 | Discharge: 2016-07-02 | Disposition: A | Discharge status: Home / Self Care | Payer: Medicaid Other

## 2016-07-02 ENCOUNTER — Encounter: Payer: Self-pay | Admitting: Gynecology

## 2016-07-02 NOTE — ED Triage Notes (Signed)
Per patient lump in right side nose x 3 days and is very painful.

## 2016-07-15 ENCOUNTER — Other Ambulatory Visit: Payer: Self-pay | Admitting: Internal Medicine

## 2016-07-15 DIAGNOSIS — N6453 Retraction of nipple: Secondary | ICD-10-CM

## 2016-07-15 DIAGNOSIS — R102 Pelvic and perineal pain: Secondary | ICD-10-CM

## 2016-07-18 ENCOUNTER — Ambulatory Visit
Admission: RE | Admit: 2016-07-18 | Discharge: 2016-07-18 | Disposition: A | Discharge status: Home / Self Care | Payer: Medicaid Other | Source: Ambulatory Visit | Attending: Internal Medicine | Admitting: Internal Medicine

## 2016-07-18 DIAGNOSIS — N6453 Retraction of nipple: Secondary | ICD-10-CM

## 2016-07-18 HISTORY — DX: Other signs and symptoms in breast: N64.59

## 2016-07-20 ENCOUNTER — Ambulatory Visit: Payer: Medicaid Other

## 2016-07-21 ENCOUNTER — Other Ambulatory Visit: Payer: Medicaid Other

## 2016-08-17 ENCOUNTER — Ambulatory Visit: Payer: Medicaid Other

## 2016-08-24 ENCOUNTER — Ambulatory Visit
Admission: RE | Admit: 2016-08-24 | Discharge: 2016-08-24 | Disposition: A | Discharge status: Home / Self Care | Payer: Medicaid Other | Source: Ambulatory Visit | Attending: Internal Medicine | Admitting: Internal Medicine

## 2016-08-24 DIAGNOSIS — Z90710 Acquired absence of both cervix and uterus: Secondary | ICD-10-CM | POA: Insufficient documentation

## 2016-08-24 DIAGNOSIS — R102 Pelvic and perineal pain: Secondary | ICD-10-CM

## 2016-08-24 DIAGNOSIS — N888 Other specified noninflammatory disorders of cervix uteri: Secondary | ICD-10-CM | POA: Diagnosis not present

## 2017-03-26 ENCOUNTER — Other Ambulatory Visit: Payer: Self-pay

## 2017-03-26 ENCOUNTER — Emergency Department: Payer: Medicaid Other

## 2017-03-26 ENCOUNTER — Encounter: Payer: Self-pay | Admitting: Emergency Medicine

## 2017-03-26 ENCOUNTER — Emergency Department
Admission: EM | Admit: 2017-03-26 | Discharge: 2017-03-26 | Disposition: A | Discharge status: Home / Self Care | Payer: Medicaid Other | Attending: Emergency Medicine | Admitting: Emergency Medicine

## 2017-03-26 DIAGNOSIS — I110 Hypertensive heart disease with heart failure: Secondary | ICD-10-CM | POA: Insufficient documentation

## 2017-03-26 DIAGNOSIS — I509 Heart failure, unspecified: Secondary | ICD-10-CM | POA: Insufficient documentation

## 2017-03-26 DIAGNOSIS — R110 Nausea: Secondary | ICD-10-CM | POA: Insufficient documentation

## 2017-03-26 DIAGNOSIS — F17210 Nicotine dependence, cigarettes, uncomplicated: Secondary | ICD-10-CM | POA: Insufficient documentation

## 2017-03-26 DIAGNOSIS — E119 Type 2 diabetes mellitus without complications: Secondary | ICD-10-CM | POA: Insufficient documentation

## 2017-03-26 DIAGNOSIS — J45909 Unspecified asthma, uncomplicated: Secondary | ICD-10-CM | POA: Diagnosis not present

## 2017-03-26 DIAGNOSIS — Z91040 Latex allergy status: Secondary | ICD-10-CM | POA: Insufficient documentation

## 2017-03-26 DIAGNOSIS — R0789 Other chest pain: Secondary | ICD-10-CM | POA: Diagnosis present

## 2017-03-26 DIAGNOSIS — Z880 Allergy status to penicillin: Secondary | ICD-10-CM | POA: Insufficient documentation

## 2017-03-26 LAB — CBC
HEMATOCRIT: 40.9 % (ref 35.0–47.0)
HEMOGLOBIN: 14.1 g/dL (ref 12.0–16.0)
MCH: 32.4 pg (ref 26.0–34.0)
MCHC: 34.3 g/dL (ref 32.0–36.0)
MCV: 94.2 fL (ref 80.0–100.0)
Platelets: 225 10*3/uL (ref 150–440)
RBC: 4.34 MIL/uL (ref 3.80–5.20)
RDW: 12.9 % (ref 11.5–14.5)
WBC: 9.1 10*3/uL (ref 3.6–11.0)

## 2017-03-26 LAB — BASIC METABOLIC PANEL
Anion gap: 9 (ref 5–15)
BUN: 19 mg/dL (ref 6–20)
CHLORIDE: 106 mmol/L (ref 101–111)
CO2: 23 mmol/L (ref 22–32)
CREATININE: 0.66 mg/dL (ref 0.44–1.00)
Calcium: 8.5 mg/dL — ABNORMAL LOW (ref 8.9–10.3)
GFR calc Af Amer: >60 mL/min (ref >60)
GFR calc non Af Amer: >60 mL/min (ref >60)
GLUCOSE: 80 mg/dL (ref 65–99)
Potassium: 3.6 mmol/L (ref 3.5–5.1)
SODIUM: 138 mmol/L (ref 135–145)

## 2017-03-26 LAB — TROPONIN I: Troponin I: <0.03 ng/mL (ref <0.03)

## 2017-03-26 MED ORDER — CYCLOBENZAPRINE HCL 10 MG PO TABS
10.0000 mg | ORAL_TABLET | Freq: Once | ORAL | Status: AC
  Administered 2017-03-26: 10 mg via ORAL
  Filled 2017-03-26: qty 1

## 2017-03-26 MED ORDER — CYCLOBENZAPRINE HCL 10 MG PO TABS: 10.0000 mg | ORAL_TABLET | Freq: Three times a day (TID) | ORAL | 0 refills | Status: DC | PRN

## 2017-03-26 MED ORDER — KETOROLAC TROMETHAMINE 30 MG/ML IJ SOLN
30.0000 mg | Freq: Once | INTRAMUSCULAR | Status: DC
  Filled 2017-03-26: qty 1

## 2017-03-26 NOTE — ED Notes (Signed)
Patient transported to X-ray 

## 2017-03-26 NOTE — ED Provider Notes (Signed)
[facility] Emergency Department Provider Note ____________________________________________   First MD Initiated Contact with Patient 03/26/17 [id number]     (approximate)  I have reviewed the triage vital signs and the nursing notes.   HISTORY  Chief Complaint Chest Pain    HPI Desiree Cruz is a 46 y.o. female with past medical history as noted below who presents with right-sided chest pain, acute onset 90 minutes ago when she leaned to take something out of the drawer, described as sharp, non-radiating, although worse when she turns her torso or moves her right arm.  Patient reports some associated nausea with the pain, but denies lightheadedness, weakness, or difficulty breathing.  No prior history of this pain.  No recent leg pain or swelling, recent surgery or immobilization, or medication changes.   Past Medical History:  Diagnosis Date  . Asthma   . CHF (congestive heart failure) (HCC)   . Diabetes mellitus without complication (HCC)   . Hypertension   . Inverted nipple    recent Bil nipple inversion 03/18  . Migraines     There are no active problems to display for this patient.   Past Surgical History:  Procedure Laterality Date  . ABDOMINAL HYSTERECTOMY      Prior to Admission medications   Medication Sig Start Date End Date Taking? Authorizing Provider  azithromycin (ZITHROMAX Z-PAK) 250 MG tablet Take 2 tablets (500 mg) on  Day 1,  followed by 1 tablet (250 mg) once daily on Days 2 through 5. 05/06/16   Hagler, Jami L, PA-C  diazepam (VALIUM) 5 MG tablet Take 1 tablet (5 mg total) by mouth every 8 (eight) hours as needed for anxiety. 07/10/15   Emily FilbertWilliams, Jonathan E, MD  diclofenac (VOLTAREN) 75 MG EC tablet Take 75 mg by mouth 2 (two) times daily.    [provider]  FLUoxetine (PROZAC) 20 MG capsule Take 20 mg by mouth daily.    [provider]  fluticasone (FLONASE) 50 MCG/ACT nasal spray Place 2 sprays into both  nostrils daily. 05/06/16   Hagler, Jami L, PA-C  gabapentin (NEURONTIN) 300 MG capsule Take 300 mg by mouth 2 (two) times daily.    [provider]  guaiFENesin-codeine 100-10 MG/5ML syrup Take 5 mLs by mouth every 6 (six) hours as needed for cough. 07/19/15   Minna AntisPaduchowski, Kevin, MD  HYDROcodone-acetaminophen (NORCO/VICODIN) 5-325 MG tablet Take 1 tablet by mouth every 4 (four) hours as needed for moderate pain. 03/18/15   Tommi RumpsSummers, Rhonda L, PA-C  metoprolol succinate (TOPROL-XL) 25 MG 24 hr tablet Take 25 mg by mouth daily.    [provider]  mirtazapine (REMERON) 30 MG tablet Take 30 mg by mouth at bedtime.    [provider]  moxifloxacin (VIGAMOX) 0.5 % ophthalmic solution Place 1 drop into the right eye 3 (three) times daily. For 5 days 11/02/14   Payton Mccallumonty, [place], MD  ondansetron (ZOFRAN) 4 MG tablet Take 1 tablet (4 mg total) by mouth daily as needed for nausea or vomiting. 07/14/15   Jene EveryKinner, Robert, MD  promethazine-dextromethorphan (PROMETHAZINE-DM) 6.25-15 MG/5ML syrup Take 5 mLs by mouth 4 (four) times daily as needed for cough. 05/06/16   Hagler, Jami L, PA-C    Allergies Latex and Penicillins  Family History  Problem Relation Age of Onset  . Breast cancer Other     Social History Social History   Tobacco Use  . Smoking status: Current Every Day Smoker    Packs/day: 0.50  Types: Cigarettes  . Smokeless tobacco: Never Used  Substance Use Topics  . Alcohol use: No    Alcohol/week: 0.0 oz  . Drug use: Not on file    Review of Systems  Constitutional: No fever/chills.  Eyes: No redness. ENT: No neck pain. Cardiovascular: Positive for chest pain. Respiratory: Denies shortness of breath. Gastrointestinal: Positive for nausea, no vomiting.  No diarrhea.  Genitourinary: Negative for dysuria or flank pain.  Musculoskeletal: Negative for back pain. Skin: Negative for rash. Neurological: Negative for headaches, focal weakness or  numbness.   ____________________________________________   PHYSICAL EXAM:  VITAL SIGNS: ED Triage Vitals  Enc Vitals Group     BP 03/26/17 1458 100/75     Pulse Rate 03/26/17 1458 96     Resp 03/26/17 1458 16     Temp 03/26/17 1458 97.7 F (36.5 C)     Temp src --      SpO2 03/26/17 1458 100 %     Weight 03/26/17 1456 236 lb (107 kg)     Height 03/26/17 1456 5\' 9"  (1.753 m)     Head Circumference --      Peak Flow --      Pain Score 03/26/17 1456 10     Pain Loc --      Pain Edu? --      Excl. in GC? --     Constitutional: Alert and oriented. Well appearing and in no acute distress. Eyes: Conjunctivae are normal.  Head: Atraumatic. Nose: No congestion/rhinnorhea. Mouth/Throat: Mucous membranes are moist.   Neck: Normal range of motion.  Cardiovascular: Normal rate, regular rhythm. Grossly normal heart sounds.  Good peripheral circulation.  Reproducible right anterior chest wall tenderness. Respiratory: Normal respiratory effort.  No retractions. Lungs CTAB. Gastrointestinal: No distention.  Genitourinary: No CVA tenderness. Musculoskeletal: No lower extremity edema.  Extremities warm and well perfused.  Neurologic:  Normal speech and language. No gross focal neurologic deficits are appreciated.  Skin:  Skin is warm and dry. No rash noted. Psychiatric: Mood and affect are normal. Speech and behavior are normal.  ____________________________________________   LABS (all labs ordered are listed, but only abnormal results are displayed)  Labs Reviewed  BASIC METABOLIC PANEL - Abnormal; Notable for the following components:      Result Value   Calcium 8.5 (*)    All other components within normal limits  CBC  TROPONIN I   ____________________________________________  EKG  ED ECG REPORT I, Dionne Bucy, the attending physician, personally viewed and interpreted this ECG.  Date: 03/26/2017 EKG Time: 1451 Rate: 110 Rhythm: Sinus tachycardia QRS Axis:  normal Intervals: normal ST/T Wave abnormalities: normal Narrative Interpretation: no evidence of acute ischemia  ____________________________________________  RADIOLOGY  CXR: No focal infiltrate or other acute findings.  ____________________________________________   PROCEDURES  Procedure(s) performed: No    Critical Care performed: No ____________________________________________   INITIAL IMPRESSION / ASSESSMENT AND PLAN / ED COURSE  Pertinent labs & imaging results that were available during my care of the patient were reviewed by me and considered in my medical decision making (see chart for details).  46 year old female with past medical history as noted above including a history of CHF presents with atypical right-sided chest pain acute onset after she reached into a drawer, and sharp and reproducible with movement and right arm position.  No significant associated symptoms except for slight nausea.  Past medical records were reviewed in epic, and are noncontributory.  On exam, patient is well-appearing, the vital  signs are normal, and there is reproducible anterior chest wall tenderness which corresponds with the pain the patient reports.  Patient states that she thinks she pulled a muscle.  Overall presentation is indeed consistent with muscle strain/spasm, or other musculoskeletal chest wall cause.  I have a very low suspicion for ACS.  Given patient's cardiac history will obtain troponin x1 and basic labs, as well as a chest x-ray.  Patient was briefly tachycardic during the time of the EKG, but otherwise her heart rate has been normal.  I initially ordered a d-dimer, but after assessment of the patient and her history, there is no evidence of DVT or PE.  Patient has no PE risk factors and is PERC negative.  D-dimer therefore not indicated and was canceled.  Anticipate discharge home if negative ED workup.    ----------------------------------------- 5:23 PM on  03/26/2017 -----------------------------------------  Patient's workup is negative.  Given that the pain started approximately 90 minutes prior to her coming to the emergency department and she was here for just over an hour when the labs were drawn, I offered to keep patient in the ED for another 2 hours so that we can get another troponin IV hours from symptom onset.  Patient declines.  She states that she has an important appointment to attend to this afternoon and would like to leave now.  She understands without doing this additional test that I cannot fully rule out heart attack or permanent damage to the heart.  However given patient's presentation and the reproducible nature of the pain, my suspicion for ACS is extremely low.  I discussed the results of the workup and gave return precautions, and patient expresses understanding.  ____________________________________________   FINAL CLINICAL IMPRESSION(S) / ED DIAGNOSES  Final diagnoses:  Chest wall pain      NEW MEDICATIONS STARTED DURING THIS VISIT:  This SmartLink is deprecated. Use AVSMEDLIST instead to display the medication list for a patient.   Note:  This document was prepared using Dragon voice recognition software and may include unintentional dictation errors.     Dionne BucySiadecki, Orpha Dain, MD 03/26/17 1724

## 2017-03-26 NOTE — ED Notes (Signed)
First Nurse Note: Pt to front desk stating that she is having chest pain. Pt states that she "has a bad heart".

## 2017-03-26 NOTE — ED Triage Notes (Signed)
Pt c/o chest pain since thurs and states worse today. ekg obtained and signed

## 2017-03-26 NOTE — Discharge Instructions (Signed)
Return to the ER for new, worsening, or constant pain, difficulty breathing, weakness or lightheadedness, fevers, or any other new or worsening symptoms that concern you.  You can take the muscle relaxant prescribed today for the pain.  You can also take your normal pain medications.  Follow-up with your regular doctor.

## 2017-05-25 ENCOUNTER — Encounter: Payer: Self-pay | Admitting: Emergency Medicine

## 2017-05-25 ENCOUNTER — Emergency Department
Admission: EM | Admit: 2017-05-25 | Discharge: 2017-05-25 | Disposition: A | Discharge status: Home / Self Care | Payer: Medicaid Other | Attending: Emergency Medicine | Admitting: Emergency Medicine

## 2017-05-25 ENCOUNTER — Emergency Department: Payer: Medicaid Other

## 2017-05-25 DIAGNOSIS — J45909 Unspecified asthma, uncomplicated: Secondary | ICD-10-CM | POA: Diagnosis not present

## 2017-05-25 DIAGNOSIS — J069 Acute upper respiratory infection, unspecified: Secondary | ICD-10-CM | POA: Diagnosis not present

## 2017-05-25 DIAGNOSIS — F17210 Nicotine dependence, cigarettes, uncomplicated: Secondary | ICD-10-CM | POA: Insufficient documentation

## 2017-05-25 DIAGNOSIS — I110 Hypertensive heart disease with heart failure: Secondary | ICD-10-CM | POA: Diagnosis not present

## 2017-05-25 DIAGNOSIS — E119 Type 2 diabetes mellitus without complications: Secondary | ICD-10-CM | POA: Insufficient documentation

## 2017-05-25 DIAGNOSIS — I509 Heart failure, unspecified: Secondary | ICD-10-CM | POA: Diagnosis not present

## 2017-05-25 DIAGNOSIS — Z79899 Other long term (current) drug therapy: Secondary | ICD-10-CM | POA: Insufficient documentation

## 2017-05-25 DIAGNOSIS — R05 Cough: Secondary | ICD-10-CM | POA: Diagnosis present

## 2017-05-25 MED ORDER — AZITHROMYCIN 250 MG PO TABS: ORAL_TABLET | ORAL | 0 refills | Status: DC

## 2017-05-25 MED ORDER — ALBUTEROL SULFATE HFA 108 (90 BASE) MCG/ACT IN AERS: 2.0000 | INHALATION_SPRAY | Freq: Four times a day (QID) | RESPIRATORY_TRACT | 2 refills | Status: DC | PRN

## 2017-05-25 NOTE — ED Provider Notes (Signed)
[facility] Emergency Department Provider Note  ____________________________________________   First MD Initiated Contact with Patient 05/25/17 1005     (approximate)  I have reviewed the triage vital signs and the nursing notes.   HISTORY  Chief Complaint Cough    HPI Desiree Cruz is a 47 y.o. female complains of cough, body aches and congestion for 1 week.  She states she started a fever on Saturday.  Which is about 4 days ago.  She denies chest pain or shortness of breath.  Denies any edema in the lower legs.  Past Medical History:  Diagnosis Date  . Asthma   . CHF (congestive heart failure) (HCC)   . Diabetes mellitus without complication (HCC)   . Hypertension   . Inverted nipple    recent Bil nipple inversion 03/18  . Migraines     There are no active problems to display for this patient.   Past Surgical History:  Procedure Laterality Date  . ABDOMINAL HYSTERECTOMY      Prior to Admission medications   Medication Sig Start Date End Date Taking? Authorizing Provider  sertraline (ZOLOFT) 50 MG tablet Take 50 mg by mouth daily.   Yes [provider]  albuterol (PROVENTIL HFA;VENTOLIN HFA) 108 (90 Base) MCG/ACT inhaler Inhale 2 puffs into the lungs every 6 (six) hours as needed for wheezing or shortness of breath. 05/25/17   Sherrie MustacheFisher, Roselyn BeringSusan W, PA-C  azithromycin (ZITHROMAX Z-PAK) 250 MG tablet 2 pills today then 1 pill a day for 4 days 05/25/17   Sherrie MustacheFisher, Roselyn BeringSusan W, PA-C  diclofenac (VOLTAREN) 75 MG EC tablet Take 75 mg by mouth 2 (two) times daily.    [provider]  gabapentin (NEURONTIN) 300 MG capsule Take 300 mg by mouth 2 (two) times daily.    [provider]  metoprolol succinate (TOPROL-XL) 25 MG 24 hr tablet Take 25 mg by mouth daily.    [provider]  moxifloxacin (VIGAMOX) 0.5 % ophthalmic solution Place 1 drop into the right eye 3 (three) times daily. For 5 days 11/02/14   Payton Mccallumonty, [place], MD    traZODone (DESYREL) 100 MG tablet Take 100 mg by mouth at bedtime.  03/18/15  [provider]    Allergies Latex and Penicillins  Family History  Problem Relation Age of Onset  . Breast cancer Other     Social History Social History   Tobacco Use  . Smoking status: Current Every Day Smoker    Packs/day: 0.50    Types: Cigarettes  . Smokeless tobacco: Never Used  Substance Use Topics  . Alcohol use: No    Alcohol/week: 0.0 oz  . Drug use: Not on file    Review of Systems  Constitutional: Positive fever/chills, body aches Eyes: No visual changes. ENT: No sore throat.  Positive runny nose and congestion Neck: Denies neck pain Respiratory: Positive cough GI: denies vomiting or diarrhea Genitourinary: Negative for dysuria. Musculoskeletal: Negative for back pain. Skin: Negative for rash.    ____________________________________________   PHYSICAL EXAM:  VITAL SIGNS: ED Triage Vitals  Enc Vitals Group     BP 05/25/17 0953 137/79     Pulse Rate 05/25/17 0953 84     Resp 05/25/17 0953 18     Temp 05/25/17 0953 98.9 F (37.2 C)     Temp Source 05/25/17 0953 Oral     SpO2 05/25/17 0953 96 %     Weight 05/25/17 0954 240 lb (108.9 kg)  Height 05/25/17 0954 5\' 9"  (1.753 m)     Head Circumference --      Peak Flow --      Pain Score 05/25/17 0957 8     Pain Loc --      Pain Edu? --      Excl. in GC? --     Constitutional: Alert and oriented. Well appearing and in no acute distress. Eyes: Conjunctivae are normal.  Head: Atraumatic. Nose: No congestion/rhinnorhea. Mouth/Throat: Mucous membranes are moist.  Throat is normal Neck: Is supple, no lymphadenopathy is noted Cardiovascular: Normal rate, regular rhythm.  Heart sounds are normal Respiratory: Normal respiratory effort.  No retractions, lungs clear to auscultation GU: deferred Musculoskeletal: FROM all extremities, warm and well perfused, there is no peripheral edema noted Neurologic:   Normal speech and language.  Skin:  Skin is warm, dry and intact. No rash noted. Psychiatric: Mood and affect are normal. Speech and behavior are normal.  ____________________________________________   LABS (all labs ordered are listed, but only abnormal results are displayed)  Labs Reviewed - No data to display ____________________________________________   ____________________________________________  RADIOLOGY  Chest x-ray is normal  ____________________________________________   PROCEDURES  Procedure(s) performed: No  Procedures    ____________________________________________   INITIAL IMPRESSION / ASSESSMENT AND PLAN / ED COURSE  Pertinent labs & imaging results that were available during my care of the patient were reviewed by me and considered in my medical decision making (see chart for details).  Patient is a 47 year old female complaining of cough and congestion with low-grade fever since Saturday.  She does have a cough for over a week.  On physical exam the left lower lung has some rhonchi.  Chest x-ray is normal.  Patient was given a prescription for Z-Pak and albuterol inhaler.  She is instructed to follow-up with her regular doctor if not better in 3-5 days.  She is to return to the emergency department if worsening.  Patient states she understands comply with our recommendations.  She was discharged in stable condition     As part of my medical decision making, I reviewed the following data within the electronic MEDICAL RECORD NUMBER Nursing notes reviewed and incorporated, Old chart reviewed, Radiograph reviewed chest x-ray was normal, Notes from prior ED visits and [place] Controlled Substance Database  ____________________________________________   FINAL CLINICAL IMPRESSION(S) / ED DIAGNOSES  Final diagnoses:  Acute upper respiratory infection      NEW MEDICATIONS STARTED DURING THIS VISIT:  New Prescriptions   ALBUTEROL (PROVENTIL HFA;VENTOLIN HFA)  108 (90 BASE) MCG/ACT INHALER    Inhale 2 puffs into the lungs every 6 (six) hours as needed for wheezing or shortness of breath.   AZITHROMYCIN (ZITHROMAX Z-PAK) 250 MG TABLET    2 pills today then 1 pill a day for 4 days     Note:  This document was prepared using Dragon voice recognition software and may include unintentional dictation errors.    Faythe Ghee, PA-C 05/25/17 1055    Jene Every, MD 05/25/17 1438

## 2017-05-25 NOTE — ED Notes (Signed)
See triage note  States she developed some body aches ,cough and subjective fever since Saturday  Afebrile on arrival

## 2017-05-25 NOTE — ED Triage Notes (Signed)
Patient presents to ED via POV from home with c/o cough and sneezing since Saturday. Even and non labored respirations noted.

## 2017-05-25 NOTE — Discharge Instructions (Signed)
Your chest x-ray is normal.  Please follow-up with your regular doctor if you are not better in 3-5 days.  Return to the emergency department if you are worsening.  Take the medication as prescribed.

## 2017-08-28 ENCOUNTER — Other Ambulatory Visit: Payer: Self-pay | Admitting: Internal Medicine

## 2017-08-28 DIAGNOSIS — Z1231 Encounter for screening mammogram for malignant neoplasm of breast: Secondary | ICD-10-CM

## 2017-11-10 DIAGNOSIS — E1142 Type 2 diabetes mellitus with diabetic polyneuropathy: Secondary | ICD-10-CM | POA: Insufficient documentation

## 2017-11-13 ENCOUNTER — Other Ambulatory Visit: Payer: Self-pay | Admitting: Neurology

## 2017-11-13 DIAGNOSIS — R404 Transient alteration of awareness: Secondary | ICD-10-CM

## 2017-11-24 ENCOUNTER — Ambulatory Visit
Admission: RE | Admit: 2017-11-24 | Discharge: 2017-11-24 | Disposition: A | Discharge status: Home / Self Care | Payer: Medicaid Other | Source: Ambulatory Visit | Attending: Neurology | Admitting: Neurology

## 2017-11-24 DIAGNOSIS — R404 Transient alteration of awareness: Secondary | ICD-10-CM | POA: Diagnosis present

## 2017-11-24 DIAGNOSIS — I611 Nontraumatic intracerebral hemorrhage in hemisphere, cortical: Secondary | ICD-10-CM | POA: Insufficient documentation

## 2017-11-24 LAB — POCT I-STAT CREATININE: CREATININE: 0.7 mg/dL (ref 0.44–1.00)

## 2017-11-24 MED ORDER — GADOBENATE DIMEGLUMINE 529 MG/ML IV SOLN
20.0000 mL | Freq: Once | INTRAVENOUS | Status: AC | PRN
  Administered 2017-11-24: 20 mL via INTRAVENOUS

## 2017-12-27 ENCOUNTER — Ambulatory Visit: Payer: Medicaid Other | Admitting: Podiatry

## 2017-12-27 ENCOUNTER — Encounter: Payer: Self-pay | Admitting: Podiatry

## 2017-12-27 ENCOUNTER — Ambulatory Visit (INDEPENDENT_AMBULATORY_CARE_PROVIDER_SITE_OTHER): Payer: Medicaid Other

## 2017-12-27 VITALS — BP 95/75 | HR 74 | Resp 16

## 2017-12-27 DIAGNOSIS — M778 Other enthesopathies, not elsewhere classified: Secondary | ICD-10-CM

## 2017-12-27 DIAGNOSIS — M7751 Other enthesopathy of right foot: Secondary | ICD-10-CM

## 2017-12-27 DIAGNOSIS — M779 Enthesopathy, unspecified: Principal | ICD-10-CM

## 2017-12-27 DIAGNOSIS — F32A Depression, unspecified: Secondary | ICD-10-CM | POA: Insufficient documentation

## 2017-12-27 DIAGNOSIS — F329 Major depressive disorder, single episode, unspecified: Secondary | ICD-10-CM | POA: Insufficient documentation

## 2017-12-27 NOTE — Progress Notes (Signed)
Subjective:  Patient ID: Desiree Cruz, female    DOB: 02/13/1971,  MRN: [id number] HPI Chief Complaint  Patient presents with  . Foot Pain    Dorsal midfoot right - aching x 1 year, MRI done last year, rx'd steroid pack and "pain gel", her other docs think just arthritis, getting worse, trouble walking, tried ice, heat, tylenol and advil - no help  . New Patient (Initial Visit)    47 y.o. female presents with the above complaint.   ROS: Denies fever chills nausea vomiting muscle aches pains calf pain back pain chest pain shortness of breath.  Past Medical History:  Diagnosis Date  . Asthma   . CHF (congestive heart failure) ([place])   . Diabetes mellitus without complication ([place])   . Hypertension   . Inverted nipple    recent Bil nipple inversion 03/18  . Migraines    Past Surgical History:  Procedure Laterality Date  . ABDOMINAL HYSTERECTOMY      Current Outpatient Medications:  .  atorvastatin (LIPITOR) 40 MG tablet, Take 40 mg by mouth daily., Disp: , Rfl:  .  loratadine (CLARITIN) 10 MG tablet, Take 10 mg by mouth daily., Disp: , Rfl:  .  ondansetron (ZOFRAN) 4 MG tablet, Take 4 mg by mouth every 8 (eight) hours as needed for nausea or vomiting., Disp: , Rfl:  .  albuterol (PROVENTIL HFA;VENTOLIN HFA) 108 (90 Base) MCG/ACT inhaler, Inhale 2 puffs into the lungs every 6 (six) hours as needed for wheezing or shortness of breath., Disp: 1 Inhaler, Rfl: 2 .  Blood Glucose Monitoring Suppl (ACCU-CHEK GUIDE) w/Device KIT, See admin instructions., Disp: , Rfl: 0 .  Cholecalciferol (VITAMIN D3) 50000 units CAPS, Take 1 capsule by mouth once a week., Disp: , Rfl: 3 .  furosemide (LASIX) 20 MG tablet, Take 20 mg by mouth daily., Disp: , Rfl: 3 .  gabapentin (NEURONTIN) 300 MG capsule, Take 300 mg by mouth 2 (two) times daily., Disp: , Rfl:  .  montelukast (SINGULAIR) 10 MG tablet, Take by mouth., Disp: , Rfl:  .  pravastatin (PRAVACHOL) 40 MG tablet, Take 40 mg by mouth at  bedtime., Disp: , Rfl: 1 .  traZODone (DESYREL) 100 MG tablet, TAKE 1 TABLET BY MOUTH 1 HOUR BEFORE BEDTIME, Disp: , Rfl: 2  Allergies  Allergen Reactions  . Latex Rash  . Penicillins Rash   Review of Systems Objective:   Vitals:   12/27/17 0824  BP: 95/75  Pulse: 74  Resp: 16    General: Well developed, nourished, in no acute distress, alert and oriented x3   Dermatological: Skin is warm, dry and supple bilateral. Nails x 10 are well maintained; remaining integument appears unremarkable at this time. There are no open sores, no preulcerative lesions, no rash or signs of infection present.  Vascular: Dorsalis Pedis artery and Posterior Tibial artery pedal pulses are 2/4 bilateral with immedate capillary fill time. Pedal hair growth present. No varicosities and no lower extremity edema present bilateral.   Neruologic: Grossly intact via light touch bilateral. Vibratory intact via tuning fork bilateral. Protective threshold with Semmes Wienstein monofilament intact to all pedal sites bilateral. Patellar and Achilles deep tendon reflexes 2+ bilateral. No Babinski or clonus noted bilateral.  Pain on palpation of the deep peroneal nerve across the dorsal aspect of the right foot.  Particularly along Lisfranc's joints.  Musculoskeletal: No gross boney pedal deformities bilateral. No pain, crepitus, or limitation noted with foot and ankle range of motion bilateral. Muscular  strength 5/5 in all groups tested bilateral.  Pain on frontal plane range of motion of Lisfranc's joints.  She also has pain on palpation of the tarsometatarsal joints as well as the talonavicular joint.  Gait: Unassisted, Nonantalgic.    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  Soft tissue swelling across the dorsum of the foot.  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  Tolerated procedure well.  States that it was come completely alleviated prior to her leaving the office.     Staysha Truby T. Jackson, [place]

## 2018-01-24 ENCOUNTER — Ambulatory Visit: Payer: Medicaid Other | Admitting: Podiatry

## 2018-02-19 ENCOUNTER — Other Ambulatory Visit: Payer: Self-pay

## 2018-02-19 ENCOUNTER — Emergency Department
Admission: EM | Admit: 2018-02-19 | Discharge: 2018-02-19 | Disposition: A | Discharge status: Home / Self Care | Payer: Medicaid Other | Attending: Emergency Medicine | Admitting: Emergency Medicine

## 2018-02-19 DIAGNOSIS — I110 Hypertensive heart disease with heart failure: Secondary | ICD-10-CM | POA: Diagnosis not present

## 2018-02-19 DIAGNOSIS — Z79899 Other long term (current) drug therapy: Secondary | ICD-10-CM | POA: Diagnosis not present

## 2018-02-19 DIAGNOSIS — L509 Urticaria, unspecified: Secondary | ICD-10-CM | POA: Insufficient documentation

## 2018-02-19 DIAGNOSIS — Z91040 Latex allergy status: Secondary | ICD-10-CM | POA: Diagnosis not present

## 2018-02-19 DIAGNOSIS — R21 Rash and other nonspecific skin eruption: Secondary | ICD-10-CM | POA: Diagnosis present

## 2018-02-19 DIAGNOSIS — E119 Type 2 diabetes mellitus without complications: Secondary | ICD-10-CM | POA: Insufficient documentation

## 2018-02-19 DIAGNOSIS — I509 Heart failure, unspecified: Secondary | ICD-10-CM | POA: Insufficient documentation

## 2018-02-19 DIAGNOSIS — F17210 Nicotine dependence, cigarettes, uncomplicated: Secondary | ICD-10-CM | POA: Insufficient documentation

## 2018-02-19 DIAGNOSIS — F329 Major depressive disorder, single episode, unspecified: Secondary | ICD-10-CM | POA: Insufficient documentation

## 2018-02-19 DIAGNOSIS — J45909 Unspecified asthma, uncomplicated: Secondary | ICD-10-CM | POA: Insufficient documentation

## 2018-02-19 MED ORDER — RANITIDINE HCL 150 MG PO TABS: 150.0000 mg | ORAL_TABLET | Freq: Two times a day (BID) | ORAL | 0 refills | Status: DC

## 2018-02-19 MED ORDER — PREDNISONE 20 MG PO TABS
60.0000 mg | ORAL_TABLET | Freq: Once | ORAL | Status: AC
  Administered 2018-02-19: 60 mg via ORAL
  Filled 2018-02-19: qty 3

## 2018-02-19 MED ORDER — DIPHENHYDRAMINE HCL 25 MG PO CAPS: 25.0000 mg | ORAL_CAPSULE | ORAL | 0 refills | Status: DC | PRN

## 2018-02-19 MED ORDER — HYDROXYZINE HCL 25 MG PO TABS
25.0000 mg | ORAL_TABLET | Freq: Once | ORAL | Status: AC
  Administered 2018-02-19: 25 mg via ORAL

## 2018-02-19 MED ORDER — PREDNISONE 10 MG PO TABS: ORAL_TABLET | ORAL | 0 refills | Status: DC

## 2018-02-19 MED ORDER — HYDROXYZINE HCL 10 MG PO TABS
10.0000 mg | ORAL_TABLET | Freq: Once | ORAL | Status: DC
  Filled 2018-02-19: qty 1

## 2018-02-19 MED ORDER — HYDROXYZINE HCL 25 MG PO TABS
ORAL_TABLET | ORAL | Status: AC
  Filled 2018-02-19: qty 1

## 2018-02-19 MED ORDER — FAMOTIDINE 20 MG PO TABS
20.0000 mg | ORAL_TABLET | Freq: Once | ORAL | Status: AC
  Administered 2018-02-19: 20 mg via ORAL
  Filled 2018-02-19: qty 1

## 2018-02-19 NOTE — ED Triage Notes (Addendum)
Pt had EEG completed this weekend. States she has a rash on her head from the treatment.

## 2018-02-19 NOTE — ED Notes (Signed)
See triage note  Presents with rash  States she had an EEG done this weekend developed a rash and some redness to face  No resp distress noted
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
## 2018-05-31 ENCOUNTER — Ambulatory Visit: Payer: Medicaid Other | Admitting: Neurology

## 2018-06-07 ENCOUNTER — Encounter: Payer: Self-pay | Admitting: Emergency Medicine

## 2018-06-07 ENCOUNTER — Emergency Department
Admission: EM | Admit: 2018-06-07 | Discharge: 2018-06-07 | Disposition: A | Discharge status: Home / Self Care | Payer: Medicaid Other | Attending: Emergency Medicine | Admitting: Emergency Medicine

## 2018-06-07 ENCOUNTER — Other Ambulatory Visit: Payer: Self-pay

## 2018-06-07 DIAGNOSIS — I509 Heart failure, unspecified: Secondary | ICD-10-CM | POA: Diagnosis not present

## 2018-06-07 DIAGNOSIS — F17210 Nicotine dependence, cigarettes, uncomplicated: Secondary | ICD-10-CM | POA: Insufficient documentation

## 2018-06-07 DIAGNOSIS — Y999 Unspecified external cause status: Secondary | ICD-10-CM | POA: Diagnosis not present

## 2018-06-07 DIAGNOSIS — Y92003 Bedroom of unspecified non-institutional (private) residence as the place of occurrence of the external cause: Secondary | ICD-10-CM | POA: Insufficient documentation

## 2018-06-07 DIAGNOSIS — E119 Type 2 diabetes mellitus without complications: Secondary | ICD-10-CM | POA: Insufficient documentation

## 2018-06-07 DIAGNOSIS — J45909 Unspecified asthma, uncomplicated: Secondary | ICD-10-CM | POA: Insufficient documentation

## 2018-06-07 DIAGNOSIS — I2510 Atherosclerotic heart disease of native coronary artery without angina pectoris: Secondary | ICD-10-CM | POA: Insufficient documentation

## 2018-06-07 DIAGNOSIS — S46911A Strain of unspecified muscle, fascia and tendon at shoulder and upper arm level, right arm, initial encounter: Secondary | ICD-10-CM | POA: Diagnosis not present

## 2018-06-07 DIAGNOSIS — I110 Hypertensive heart disease with heart failure: Secondary | ICD-10-CM | POA: Diagnosis not present

## 2018-06-07 DIAGNOSIS — X500XXA Overexertion from strenuous movement or load, initial encounter: Secondary | ICD-10-CM | POA: Diagnosis not present

## 2018-06-07 DIAGNOSIS — Z79899 Other long term (current) drug therapy: Secondary | ICD-10-CM | POA: Diagnosis not present

## 2018-06-07 DIAGNOSIS — S4991XA Unspecified injury of right shoulder and upper arm, initial encounter: Secondary | ICD-10-CM | POA: Diagnosis present

## 2018-06-07 DIAGNOSIS — Y9389 Activity, other specified: Secondary | ICD-10-CM | POA: Diagnosis not present

## 2018-06-07 HISTORY — DX: Atherosclerotic heart disease of native coronary artery without angina pectoris: I25.10

## 2018-06-07 MED ORDER — CYCLOBENZAPRINE HCL 5 MG PO TABS: 5.0000 mg | ORAL_TABLET | Freq: Three times a day (TID) | ORAL | 0 refills | Status: DC | PRN

## 2018-06-07 MED ORDER — KETOROLAC TROMETHAMINE 10 MG PO TABS: 10.0000 mg | ORAL_TABLET | Freq: Three times a day (TID) | ORAL | 0 refills | Status: DC

## 2018-06-07 MED ORDER — KETOROLAC TROMETHAMINE 30 MG/ML IJ SOLN
30.0000 mg | Freq: Once | INTRAMUSCULAR | Status: AC
  Administered 2018-06-07: 30 mg via INTRAMUSCULAR
  Filled 2018-06-07: qty 1

## 2018-06-07 NOTE — Discharge Instructions (Signed)
Your exam is consistent with a shoulder strain. Take the muscle relaxant and anti-inflammatory as directed.

## 2018-06-07 NOTE — ED Triage Notes (Signed)
Says her dog jumped on her and hurt her right hsoulder 1 week ago.  Has tried otc, heat without relief.  Says difficulty sleeping due to pain.  Has some scratches on dog.  She got him from pound, so upt to dat on shots

## 2018-06-07 NOTE — ED Provider Notes (Signed)
[facility] Emergency Department Provider Note ____________________________________________  Time seen: 1206  I have reviewed the triage vital signs and the nursing notes.  HISTORY  Chief Complaint  Shoulder Injury  HPI Desiree Cruz is a 48 y.o. female presents to the ED for evaluation of right shoulder pain. She describes her large dog jumped on her while she was sitting on the bed. This caused her to fall back onto the bed. She felt a "pop" to the right shoulder. She has taken Tylenol and Motrin and applied Flexall to the shoulder. She reports ongoing throbbing pain. She denies distal paresthesias or grip changes.   Past Medical History:  Diagnosis Date  . Asthma   . CHF (congestive heart failure) ([place])   . Coronary artery disease   . Diabetes mellitus without complication ([place])   . Hypertension   . Inverted nipple    recent Bil nipple inversion 03/18  . Migraines     Patient Active Problem List   Diagnosis Date Noted  . Depression 12/27/2017  . Diabetic polyneuropathy associated with type 2 diabetes mellitus ([place]) 11/10/2017  . Tobacco use disorder 05/16/2013  . Nausea 12/24/2012  . Sleep disturbance 12/24/2012  . Major depressive disorder, recurrent episode ([place]) 12/08/2011  . Posttraumatic stress disorder 12/08/2011    Past Surgical History:  Procedure Laterality Date  . ABDOMINAL HYSTERECTOMY      Prior to Admission medications   Medication Sig Start Date End Date Taking? Authorizing Provider  albuterol (PROVENTIL HFA;VENTOLIN HFA) 108 (90 Base) MCG/ACT inhaler Inhale 2 puffs into the lungs every 6 (six) hours as needed for wheezing or shortness of breath. 05/25/17   Fisher, Linden Dolin, PA-C  atorvastatin (LIPITOR) 40 MG tablet Take 40 mg by mouth daily.    [provider]  Blood Glucose Monitoring Suppl (ACCU-CHEK GUIDE) w/Device KIT See admin instructions. 11/29/17   [provider]  Cholecalciferol (VITAMIN D3) 50000  units CAPS Take 1 capsule by mouth once a week. 11/10/17   [provider]  cyclobenzaprine (FLEXERIL) 5 MG tablet Take 1 tablet (5 mg total) by mouth 3 (three) times daily as needed for muscle spasms. 06/07/18   Chyenne Sobczak, Dannielle Karvonen, PA-C  diphenhydrAMINE (BENADRYL) 25 mg capsule Take 1 capsule (25 mg total) by mouth every 4 (four) hours as needed. 02/19/18 02/19/19  Laban Emperor, PA-C  furosemide (LASIX) 20 MG tablet Take 20 mg by mouth daily. 11/29/17   [provider]  gabapentin (NEURONTIN) 300 MG capsule Take 300 mg by mouth 2 (two) times daily.    [provider]  ketorolac (TORADOL) 10 MG tablet Take 1 tablet (10 mg total) by mouth every 8 (eight) hours. 06/07/18   Debbie Yearick, Dannielle Karvonen, PA-C  loratadine (CLARITIN) 10 MG tablet Take 10 mg by mouth daily.    [provider]  montelukast (SINGULAIR) 10 MG tablet Take by mouth.    [provider]  ondansetron (ZOFRAN) 4 MG tablet Take 4 mg by mouth every 8 (eight) hours as needed for nausea or vomiting.    [provider]  pravastatin (PRAVACHOL) 40 MG tablet Take 40 mg by mouth at bedtime. 10/04/17   [provider]  predniSONE (DELTASONE) 10 MG tablet Take 6 tablets on day 1, take 5 tablets on day 2, take 4 tablets on day 3, take 3 tablets on day 4, take 2 tablets on day 5, take 1 tablet on day 6 02/19/18   Laban Emperor, PA-C  ranitidine (ZANTAC)  150 MG tablet Take 1 tablet (150 mg total) by mouth 2 (two) times daily. 02/19/18 02/19/19  Laban Emperor, PA-C  traZODone (DESYREL) 100 MG tablet TAKE 1 TABLET BY MOUTH 1 HOUR BEFORE BEDTIME 11/30/17   [provider]    Allergies Latex and Penicillins  Family History  Problem Relation Age of Onset  . Breast cancer Other     Social History Social History   Tobacco Use  . Smoking status: Current Every Day Smoker    Packs/day: 0.50    Types: Cigarettes  . Smokeless tobacco: Never Used  Substance Use Topics  .  Alcohol use: No    Alcohol/week: 0.0 standard drinks  . Drug use: Not on file    Review of Systems  Constitutional: Negative for fever. Cardiovascular: Negative for chest pain. Respiratory: Negative for shortness of breath. Musculoskeletal: Negative for back pain. Right shoulder pain  Skin: Negative for rash. Neurological: Negative for headaches, focal weakness or numbness. ____________________________________________  PHYSICAL EXAM:  VITAL SIGNS: ED Triage Vitals  Enc Vitals Group     BP 06/07/18 1108 116/67     Pulse Rate 06/07/18 1108 72     Resp --      Temp 06/07/18 1108 98.9 F (37.2 C)     Temp Source 06/07/18 1108 Oral     SpO2 06/07/18 1108 98 %     Weight 06/07/18 1108 248 lb (112.5 kg)     Height 06/07/18 1108 _0  (1.753 m)     Head Circumference --      Peak Flow --      Pain Score 06/07/18 1111 10     Pain Loc --      Pain Edu? --      Excl. in [place]? --     Constitutional: Alert and oriented. Well appearing and in no distress. Head: Normocephalic and atraumatic. Eyes: Conjunctivae are normal. Normal extraocular movements Cardiovascular: Normal rate, regular rhythm. Normal distal pulses. Respiratory: Normal respiratory effort. No wheezes/rales/rhonchi. Gastrointestinal: Soft and nontender. No distention. Musculoskeletal: Right shoulder without any obvious deformity or dislocation.  Patient with normal active range of motion of the right shoulder.  Rotator cuff resistance testing is intact and symmetric bilaterally.  Normal composite fist bilaterally.  Nontender with normal range of motion in all extremities.  Neurologic: Cranial nerves II through XII grossly intact.  Normal UEs DTRs bilaterally.  Normal intrinsic and opposition testing noted.  Normal gait without ataxia. Normal speech and language. No gross focal neurologic deficits are appreciated. Skin:  Skin is warm, dry and intact. No rash noted. Psychiatric: Mood and affect are normal. Patient exhibits  appropriate insight and judgment. ____________________________________________   RADIOLOGY  Not Indicated ____________________________________________  PROCEDURES  Procedures ____________________________________________  INITIAL IMPRESSION / ASSESSMENT AND PLAN / ED COURSE  Patient with ED evaluation of right shoulder pain 1 week after a injury after her dog posterior back on the bed she was sitting on.  Patient with clinical picture does not concerning for internal derangement to the right shoulder.  Her exam is overall benign and reassuring.  She was discharged with prescriptions for ketorolac and cyclobenzaprine.  She is follow-up with her primary provider or return to the ED as needed. ____________________________________________  FINAL CLINICAL IMPRESSION(S) / ED DIAGNOSES  Final diagnoses:  Shoulder strain, right, initial encounter     Melvenia Needles, PA-C 06/07/18 1404    Harvest Dark, MD 06/07/18 1413

## 2018-10-13 ENCOUNTER — Emergency Department
Admission: EM | Admit: 2018-10-13 | Discharge: 2018-10-13 | Disposition: A | Discharge status: Home / Self Care | Payer: Medicaid Other | Attending: Emergency Medicine | Admitting: Emergency Medicine

## 2018-10-13 ENCOUNTER — Other Ambulatory Visit: Payer: Self-pay

## 2018-10-13 ENCOUNTER — Encounter: Payer: Self-pay | Admitting: Emergency Medicine

## 2018-10-13 ENCOUNTER — Emergency Department: Payer: Medicaid Other

## 2018-10-13 DIAGNOSIS — Y999 Unspecified external cause status: Secondary | ICD-10-CM | POA: Diagnosis not present

## 2018-10-13 DIAGNOSIS — M79631 Pain in right forearm: Secondary | ICD-10-CM | POA: Insufficient documentation

## 2018-10-13 DIAGNOSIS — W010XXA Fall on same level from slipping, tripping and stumbling without subsequent striking against object, initial encounter: Secondary | ICD-10-CM | POA: Insufficient documentation

## 2018-10-13 DIAGNOSIS — Y939 Activity, unspecified: Secondary | ICD-10-CM | POA: Diagnosis not present

## 2018-10-13 DIAGNOSIS — Z79899 Other long term (current) drug therapy: Secondary | ICD-10-CM | POA: Diagnosis not present

## 2018-10-13 DIAGNOSIS — M25511 Pain in right shoulder: Secondary | ICD-10-CM | POA: Diagnosis not present

## 2018-10-13 DIAGNOSIS — E119 Type 2 diabetes mellitus without complications: Secondary | ICD-10-CM | POA: Diagnosis not present

## 2018-10-13 DIAGNOSIS — I110 Hypertensive heart disease with heart failure: Secondary | ICD-10-CM | POA: Insufficient documentation

## 2018-10-13 DIAGNOSIS — Y929 Unspecified place or not applicable: Secondary | ICD-10-CM | POA: Diagnosis not present

## 2018-10-13 DIAGNOSIS — I509 Heart failure, unspecified: Secondary | ICD-10-CM | POA: Insufficient documentation

## 2018-10-13 DIAGNOSIS — F17210 Nicotine dependence, cigarettes, uncomplicated: Secondary | ICD-10-CM | POA: Insufficient documentation

## 2018-10-13 DIAGNOSIS — I2510 Atherosclerotic heart disease of native coronary artery without angina pectoris: Secondary | ICD-10-CM | POA: Diagnosis not present

## 2018-10-13 DIAGNOSIS — Z91040 Latex allergy status: Secondary | ICD-10-CM | POA: Insufficient documentation

## 2018-10-13 DIAGNOSIS — J45909 Unspecified asthma, uncomplicated: Secondary | ICD-10-CM | POA: Diagnosis not present

## 2018-10-13 MED ORDER — TRAMADOL HCL 50 MG PO TABS: 50.0000 mg | ORAL_TABLET | Freq: Four times a day (QID) | ORAL | 0 refills | Status: AC | PRN

## 2018-10-13 MED ORDER — LIDOCAINE 5 % EX PTCH
1.0000 | MEDICATED_PATCH | CUTANEOUS | Status: DC
  Administered 2018-10-13: 1 via TRANSDERMAL
  Filled 2018-10-13: qty 1

## 2018-10-13 MED ORDER — IBUPROFEN 600 MG PO TABS: 600.0000 mg | ORAL_TABLET | Freq: Three times a day (TID) | ORAL | 0 refills | Status: DC | PRN

## 2018-10-13 NOTE — ED Triage Notes (Signed)
Pt to ED via POV c/o fall. Pt states that she slipped and fell a few days ago and she has been having pain in her right arm since then. Pt states that pain is getting worse. PT is in NAD

## 2018-10-13 NOTE — ED Provider Notes (Signed)
[facility] Emergency Department Provider Note   ____________________________________________   First MD Initiated Contact with Patient 10/13/18 1309     (approximate)  I have reviewed the triage vital signs and the nursing notes.   HISTORY  Chief Complaint Fall    HPI Desiree Cruz is a 48 y.o. female patient complain of pain to right shoulder and forearm secondary to a slip and fall 3 days ago.  Patient states since the incident she has decreased range of motion of the shoulder with abduction overhead reaching.  Patient also complained of pain with pronation supination of the forearm.  Patient states this numbness in the fingers.  Patient has history of diabetic neuropathy.  Patient rates her pain as a 10/10.  Patient described pain as "achy".  No palliative measure for complaint.         Past Medical History:  Diagnosis Date   Asthma    CHF (congestive heart failure) ([place])    Coronary artery disease    Diabetes mellitus without complication ([place])    Hypertension    Inverted nipple    recent Bil nipple inversion 03/18   Migraines     Patient Active Problem List   Diagnosis Date Noted   Depression 12/27/2017   Diabetic polyneuropathy associated with type 2 diabetes mellitus ([place]) 11/10/2017   Tobacco use disorder 05/16/2013   Nausea 12/24/2012   Sleep disturbance 12/24/2012   Major depressive disorder, recurrent episode ([place]) 12/08/2011   Posttraumatic stress disorder 12/08/2011    Past Surgical History:  Procedure Laterality Date   ABDOMINAL HYSTERECTOMY      Prior to Admission medications   Medication Sig Start Date End Date Taking? Authorizing Provider  albuterol (PROVENTIL HFA;VENTOLIN HFA) 108 (90 Base) MCG/ACT inhaler Inhale 2 puffs into the lungs every 6 (six) hours as needed for wheezing or shortness of breath. 05/25/17   Fisher, Linden Dolin, PA-C  atorvastatin (LIPITOR) 40 MG tablet Take 40 mg by mouth daily.     [provider]  Blood Glucose Monitoring Suppl (ACCU-CHEK GUIDE) w/Device KIT See admin instructions. 11/29/17   [provider]  Cholecalciferol (VITAMIN D3) 50000 units CAPS Take 1 capsule by mouth once a week. 11/10/17   [provider]  cyclobenzaprine (FLEXERIL) 5 MG tablet Take 1 tablet (5 mg total) by mouth 3 (three) times daily as needed for muscle spasms. 06/07/18   Menshew, Dannielle Karvonen, PA-C  diphenhydrAMINE (BENADRYL) 25 mg capsule Take 1 capsule (25 mg total) by mouth every 4 (four) hours as needed. 02/19/18 02/19/19  Laban Emperor, PA-C  furosemide (LASIX) 20 MG tablet Take 20 mg by mouth daily. 11/29/17   [provider]  gabapentin (NEURONTIN) 300 MG capsule Take 300 mg by mouth 2 (two) times daily.    [provider]  ibuprofen (ADVIL) 600 MG tablet Take 1 tablet (600 mg total) by mouth every 8 (eight) hours as needed. 10/13/18   Sable Feil, PA-C  ketorolac (TORADOL) 10 MG tablet Take 1 tablet (10 mg total) by mouth every 8 (eight) hours. 06/07/18   Menshew, Dannielle Karvonen, PA-C  loratadine (CLARITIN) 10 MG tablet Take 10 mg by mouth daily.    [provider]  montelukast (SINGULAIR) 10 MG tablet Take by mouth.    [provider]  ondansetron (ZOFRAN) 4 MG tablet Take 4 mg by mouth every 8 (eight) hours as needed for nausea or vomiting.    [provider]  pravastatin (PRAVACHOL) 40  MG tablet Take 40 mg by mouth at bedtime. 10/04/17   [provider]  predniSONE (DELTASONE) 10 MG tablet Take 6 tablets on day 1, take 5 tablets on day 2, take 4 tablets on day 3, take 3 tablets on day 4, take 2 tablets on day 5, take 1 tablet on day 6 02/19/18   Laban Emperor, PA-C  ranitidine (ZANTAC) 150 MG tablet Take 1 tablet (150 mg total) by mouth 2 (two) times daily. 02/19/18 02/19/19  Laban Emperor, PA-C  traMADol (ULTRAM) 50 MG tablet Take 1 tablet (50 mg total) by mouth every 6 (six) hours as needed for up to 3  days. 10/13/18 10/16/18  Sable Feil, PA-C  traZODone (DESYREL) 100 MG tablet TAKE 1 TABLET BY MOUTH 1 HOUR BEFORE BEDTIME 11/30/17   [provider]    Allergies Latex and Penicillins  Family History  Problem Relation Age of Onset   Breast cancer Other     Social History Social History   Tobacco Use   Smoking status: Current Every Day Smoker    Packs/day: 0.50    Types: Cigarettes   Smokeless tobacco: Never Used  Substance Use Topics   Alcohol use: No    Alcohol/week: 0.0 standard drinks   Drug use: Not on file    Review of Systems Constitutional: No fever/chills Eyes: No visual changes. ENT: No sore throat. Cardiovascular: Denies chest pain. Respiratory: Denies shortness of breath. Gastrointestinal: No abdominal pain.  No nausea, no vomiting.  No diarrhea.  No constipation. Genitourinary: Negative for dysuria. Musculoskeletal: Right upper extremity pain. Skin: Negative for rash. Neurological: Negative for headaches, focal weakness or numbness. Psychiatric:  Depression and PTSD. Endocrine:  Diabetes and hypertension.  Allergic/Immunilogical: Latex and penicillin. ____________________________________________   PHYSICAL EXAM:  VITAL SIGNS: ED Triage Vitals  Enc Vitals Group     BP --      Pulse Rate 10/13/18 1238 (!) 56     Resp 10/13/18 1238 16     Temp 10/13/18 1238 98.5 F (36.9 C)     Temp Source 10/13/18 1238 Oral     SpO2 10/13/18 1238 98 %     Weight --      Height --      Head Circumference --      Peak Flow --      Pain Score 10/13/18 1236 10     Pain Loc --      Pain Edu? --      Excl. in [place]? --     Constitutional: Alert and oriented. Well appearing and in no acute distress. Cardiovascular: Normal rate, regular rhythm. Grossly normal heart sounds.  Good peripheral circulation. Respiratory: Normal respiratory effort.  No retractions. Lungs CTAB. Musculoskeletal: Obvious deformity to the right upper extremity.  Patient decreased  range of motion with adduction overhead reaching.  Patient also complain of pain on pronation and supination of the right arm.   Neurologic:  Normal speech and language. No gross focal neurologic deficits are appreciated. No gait instability. Skin:  Skin is warm, dry and intact. No rash noted. Psychiatric: Mood and affect are normal. Speech and behavior are normal.  ____________________________________________   LABS (all labs ordered are listed, but only abnormal results are displayed)  Labs Reviewed - No data to display ____________________________________________  EKG   ____________________________________________  RADIOLOGY  ED MD interpretation:    Official radiology report(s): No results found.  ____________________________________________   PROCEDURES  Procedure(s) performed (including Critical Care):  Procedures  ____________________________________________   INITIAL IMPRESSION / ASSESSMENT AND PLAN / ED COURSE  As part of my medical decision making, I reviewed the following data within the [place]         Patient presents with right upper extremity pain and numbness to the fingers secondary to a fall 3 days ago.  No obvious deformity edema or abrasion on physical exam.  Patient was further evaluated with x-rays of the right upper extremity.      ____________________________________________   FINAL CLINICAL IMPRESSION(S) / ED DIAGNOSES  Final diagnoses:  Acute pain of right shoulder     ED Discharge Orders         Ordered    traMADol (ULTRAM) 50 MG tablet  Every 6 hours PRN     10/13/18 1418    ibuprofen (ADVIL) 600 MG tablet  Every 8 hours PRN     10/13/18 1418           Note:  This document was prepared using Dragon voice recognition software and may include unintentional dictation errors.    Sable Feil, PA-C 10/15/18 1009    Nena Polio, MD 10/15/18 2116

## 2019-01-13 ENCOUNTER — Emergency Department
Admission: EM | Admit: 2019-01-13 | Discharge: 2019-01-13 | Disposition: A | Discharge status: Home / Self Care | Payer: Medicaid Other | Attending: Emergency Medicine | Admitting: Emergency Medicine

## 2019-01-13 ENCOUNTER — Emergency Department: Payer: Medicaid Other

## 2019-01-13 ENCOUNTER — Other Ambulatory Visit: Payer: Self-pay

## 2019-01-13 DIAGNOSIS — I110 Hypertensive heart disease with heart failure: Secondary | ICD-10-CM | POA: Insufficient documentation

## 2019-01-13 DIAGNOSIS — E119 Type 2 diabetes mellitus without complications: Secondary | ICD-10-CM | POA: Insufficient documentation

## 2019-01-13 DIAGNOSIS — I509 Heart failure, unspecified: Secondary | ICD-10-CM | POA: Diagnosis not present

## 2019-01-13 DIAGNOSIS — M25551 Pain in right hip: Secondary | ICD-10-CM | POA: Insufficient documentation

## 2019-01-13 DIAGNOSIS — S92024A Nondisplaced fracture of anterior process of right calcaneus, initial encounter for closed fracture: Secondary | ICD-10-CM | POA: Diagnosis not present

## 2019-01-13 DIAGNOSIS — Z79899 Other long term (current) drug therapy: Secondary | ICD-10-CM | POA: Diagnosis not present

## 2019-01-13 DIAGNOSIS — Y999 Unspecified external cause status: Secondary | ICD-10-CM | POA: Diagnosis not present

## 2019-01-13 DIAGNOSIS — J45909 Unspecified asthma, uncomplicated: Secondary | ICD-10-CM | POA: Insufficient documentation

## 2019-01-13 DIAGNOSIS — Y929 Unspecified place or not applicable: Secondary | ICD-10-CM | POA: Insufficient documentation

## 2019-01-13 DIAGNOSIS — S99921A Unspecified injury of right foot, initial encounter: Secondary | ICD-10-CM | POA: Diagnosis present

## 2019-01-13 DIAGNOSIS — S82831A Other fracture of upper and lower end of right fibula, initial encounter for closed fracture: Secondary | ICD-10-CM | POA: Insufficient documentation

## 2019-01-13 DIAGNOSIS — S92124A Nondisplaced fracture of body of right talus, initial encounter for closed fracture: Secondary | ICD-10-CM | POA: Insufficient documentation

## 2019-01-13 DIAGNOSIS — Z87891 Personal history of nicotine dependence: Secondary | ICD-10-CM | POA: Diagnosis not present

## 2019-01-13 DIAGNOSIS — Y939 Activity, unspecified: Secondary | ICD-10-CM | POA: Diagnosis not present

## 2019-01-13 DIAGNOSIS — W108XXA Fall (on) (from) other stairs and steps, initial encounter: Secondary | ICD-10-CM | POA: Insufficient documentation

## 2019-01-13 MED ORDER — ONDANSETRON 8 MG PO TBDP
8.0000 mg | ORAL_TABLET | Freq: Once | ORAL | Status: AC
  Administered 2019-01-13: 8 mg via ORAL
  Filled 2019-01-13: qty 1

## 2019-01-13 MED ORDER — OXYCODONE-ACETAMINOPHEN 5-325 MG PO TABS
1.0000 | ORAL_TABLET | Freq: Once | ORAL | Status: AC
  Administered 2019-01-13: 19:00:00 1 via ORAL
  Filled 2019-01-13: qty 1

## 2019-01-13 MED ORDER — HYDROCODONE-ACETAMINOPHEN 5-325 MG PO TABS: 1.0000 | ORAL_TABLET | ORAL | 0 refills | Status: DC | PRN

## 2019-01-13 NOTE — ED Notes (Signed)
Splint applied by tech Wilfred Lacy. Pt ambulated with walker to the bathroom without assistance.

## 2019-01-13 NOTE — ED Notes (Signed)
Pt sitting in bed calmly. No signs of distress. Pt on phone. Pt requesting an injection for pain so it will work faster. Pt given a pill.
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
## 2019-01-13 NOTE — ED Provider Notes (Signed)
[facility] Emergency Department Provider Note  ____________________________________________  Time seen: Approximately 6:31 PM  I have reviewed the triage vital signs and the nursing notes.   HISTORY  Chief Complaint Foot Pain    HPI Desiree Cruz is a 48 y.o. female who presents the emergency department via EMS for complaint of hip, shin, ankle and foot pain.  Patient was ambulating down the back stairs of the porch when the stairs gave way causing her to fall through.  Patient is complaining primarily of distal tibia, lateral ankle and foot pain but endorses hip pain as well.  She did not hit her head or lose consciousness.  She is not splinted by EMS and no medications were given in route.  Patient is unable to bear weight on the right lower extremity at this time.         Past Medical History:  Diagnosis Date  . Asthma   . CHF (congestive heart failure) ([place])   . Coronary artery disease   . Diabetes mellitus without complication ([place])   . Hypertension   . Inverted nipple    recent Bil nipple inversion 03/18  . Migraines     Patient Active Problem List   Diagnosis Date Noted  . Depression 12/27/2017  . Diabetic polyneuropathy associated with type 2 diabetes mellitus ([place]) 11/10/2017  . Tobacco use disorder 05/16/2013  . Nausea 12/24/2012  . Sleep disturbance 12/24/2012  . Major depressive disorder, recurrent episode ([place]) 12/08/2011  . Posttraumatic stress disorder 12/08/2011    Past Surgical History:  Procedure Laterality Date  . ABDOMINAL HYSTERECTOMY      Prior to Admission medications   Medication Sig Start Date End Date Taking? Authorizing Provider  albuterol (PROVENTIL HFA;VENTOLIN HFA) 108 (90 Base) MCG/ACT inhaler Inhale 2 puffs into the lungs every 6 (six) hours as needed for wheezing or shortness of breath. 05/25/17   Fisher, Linden Dolin, PA-C  atorvastatin (LIPITOR) 40 MG tablet Take 40 mg by mouth daily.    [provider]  Blood Glucose Monitoring Suppl (ACCU-CHEK GUIDE) w/Device KIT See admin instructions. 11/29/17   [provider]  Cholecalciferol (VITAMIN D3) 50000 units CAPS Take 1 capsule by mouth once a week. 11/10/17   [provider]  cyclobenzaprine (FLEXERIL) 5 MG tablet Take 1 tablet (5 mg total) by mouth 3 (three) times daily as needed for muscle spasms. 06/07/18   Menshew, Dannielle Karvonen, PA-C  diphenhydrAMINE (BENADRYL) 25 mg capsule Take 1 capsule (25 mg total) by mouth every 4 (four) hours as needed. 02/19/18 02/19/19  Laban Emperor, PA-C  furosemide (LASIX) 20 MG tablet Take 20 mg by mouth daily. 11/29/17   [provider]  gabapentin (NEURONTIN) 300 MG capsule Take 300 mg by mouth 2 (two) times daily.    [provider]  HYDROcodone-acetaminophen (NORCO/VICODIN) 5-325 MG tablet Take 1 tablet by mouth every 4 (four) hours as needed for moderate pain. 01/13/19   Gershom Brobeck, Charline Bills, PA-C  ibuprofen (ADVIL) 600 MG tablet Take 1 tablet (600 mg total) by mouth every 8 (eight) hours as needed. 10/13/18   Sable Feil, PA-C  ketorolac (TORADOL) 10 MG tablet Take 1 tablet (10 mg total) by mouth every 8 (eight) hours. 06/07/18   Menshew, Dannielle Karvonen, PA-C  loratadine (CLARITIN) 10 MG tablet Take 10 mg by mouth daily.    [provider]  montelukast (SINGULAIR) 10 MG tablet Take by mouth.    [provider]  ondansetron [facility])  4 MG tablet Take 4 mg by mouth every 8 (eight) hours as needed for nausea or vomiting.    [provider]  pravastatin (PRAVACHOL) 40 MG tablet Take 40 mg by mouth at bedtime. 10/04/17   [provider]  predniSONE (DELTASONE) 10 MG tablet Take 6 tablets on day 1, take 5 tablets on day 2, take 4 tablets on day 3, take 3 tablets on day 4, take 2 tablets on day 5, take 1 tablet on day 6 02/19/18   Laban Emperor, PA-C  ranitidine (ZANTAC) 150 MG tablet Take 1 tablet (150 mg total) by mouth 2 (two) times  daily. 02/19/18 02/19/19  Laban Emperor, PA-C  traZODone (DESYREL) 100 MG tablet TAKE 1 TABLET BY MOUTH 1 HOUR BEFORE BEDTIME 11/30/17   [provider]    Allergies Latex and Penicillins  Family History  Problem Relation Age of Onset  . Breast cancer Other     Social History Social History   Tobacco Use  . Smoking status: Former Smoker    Packs/day: 0.50    Types: Cigarettes  . Smokeless tobacco: Never Used  Substance Use Topics  . Alcohol use: No    Alcohol/week: 0.0 standard drinks  . Drug use: Never     Review of Systems  Constitutional: No fever/chills Eyes: No visual changes. No discharge ENT: No upper respiratory complaints. Cardiovascular: no chest pain. Respiratory: no cough. No SOB. Gastrointestinal: No abdominal pain.  No nausea, no vomiting.  Musculoskeletal: Positive for right hip, distal tibia, ankle, foot pain after a fall. Skin: Negative for rash, abrasions, lacerations, ecchymosis. Neurological: Negative for headaches, focal weakness or numbness. 10-point ROS otherwise negative.  ____________________________________________   PHYSICAL EXAM:  VITAL SIGNS: ED Triage Vitals  Enc Vitals Group     BP      Pulse      Resp      Temp      Temp src      SpO2      Weight      Height      Head Circumference      Peak Flow      Pain Score      Pain Loc      Pain Edu?      Excl. in [place]?      Constitutional: Alert and oriented. Well appearing and in no acute distress. Eyes: Conjunctivae are normal. PERRL. EOMI. Head: Atraumatic. ENT:      Ears:       Nose: No congestion/rhinnorhea.      Mouth/Throat: Mucous membranes are moist.  Neck: No stridor.    Cardiovascular: Normal rate, regular rhythm. Normal S1 and S2.  Good peripheral circulation. Respiratory: Normal respiratory effort without tachypnea or retractions. Lungs CTAB. Good air entry to the bases with no decreased or absent breath sounds. Musculoskeletal: Full range of motion to  all extremities. No gross deformities appreciated.  Visualization of the right lower extremity reveals no deformity but does reveal significant edema about the ankle.  Patient has limited range of motion of the hip, ankle joint due to pain.  Patient is very tender to palpation over the lateral aspect of the hip around the greater trochanter region but no other tenderness to palpation over the right hip.  Examination of the femur, proximal tibia and knee is unremarkable.  Patient expresses tenderness to palpation over the distal tibia, both the medial and lateral malleolus's, worse on the lateral than medial aspect.  Patient is very tender  to palpation midfoot as well.  Dorsalis pedis pulse intact.  Sensation intact.  No range of motion of the ankle is attempted at this time. Neurologic:  Normal speech and language. No gross focal neurologic deficits are appreciated.  Skin:  Skin is warm, dry and intact. No rash noted. Psychiatric: Mood and affect are normal. Speech and behavior are normal. Patient exhibits appropriate insight and judgement.   ____________________________________________   LABS (all labs ordered are listed, but only abnormal results are displayed)  Labs Reviewed - No data to display ____________________________________________  EKG   ____________________________________________  RADIOLOGY I personally viewed and evaluated these images as part of my medical decision making, as well as reviewing the written report by the radiologist.  Dg Thoracic Spine 2 View  Result Date: 01/13/2019 CLINICAL DATA:  Calcaneal fracture, fall EXAM: THORACIC SPINE 2 VIEWS COMPARISON:  None. FINDINGS: There is no evidence of thoracic spine fracture. Alignment is normal. No other significant bone abnormalities are identified. IMPRESSION: Negative. Electronically Signed   By: Prudencio Pair M.D.   On: 01/13/2019 19:54   Dg Lumbar Spine 2-3 Views  Result Date: 01/13/2019 CLINICAL DATA:  Calcaneal  fracture EXAM: LUMBAR SPINE - 2-3 VIEW COMPARISON:  None. FINDINGS: There is no evidence of lumbar spine fracture. Alignment is normal. Mild disc height loss with facet arthrosis most notable at L5-S1. IMPRESSION: Negative. Electronically Signed   By: Prudencio Pair M.D.   On: 01/13/2019 19:55   Dg Tibia/fibula Right  Result Date: 01/13/2019 CLINICAL DATA:  Right leg pain after fall EXAM: RIGHT TIBIA AND FIBULA - 2 VIEW; RIGHT FOOT COMPLETE - 3+ VIEW; RIGHT ANKLE - COMPLETE 3+ VIEW; DG HIP (WITH OR WITHOUT PELVIS) 2-3V RIGHT COMPARISON:  None. FINDINGS: Right hip: No acute fracture or dislocation. Joint space is preserved. Unremarkable soft tissues. Right tibia-fibula: No acute fracture or malalignment. Alignment at the knee is maintained. No knee joint effusion. Unremarkable soft tissues. Right ankle: There is an acute mildly comminuted extra-articular fracture involving the lateral cortex of the lateral malleolus. Additional mildly comminuted fracture of the lateral process of the talus. Ankle mortise is congruent without widening. Mild soft tissue swelling overlies the lateral malleolus. Right foot: Findings suggesting a minimally displaced fracture of the anterior process of the calcaneus. No dislocation. No additional fractures within the foot are clearly identified. There is soft tissue swelling of the hindfoot. Alignment at the Lisfranc joint is anatomic. IMPRESSION: 1. Acute fractures of the lateral malleolus and lateral process of the talus. 2. Probable fracture involving the anterior process of the calcaneus. 3. Right hip negative for fracture. Electronically Signed   By: Davina Poke M.D.   On: 01/13/2019 19:05   Dg Ankle Complete Right  Result Date: 01/13/2019 CLINICAL DATA:  Right leg pain after fall EXAM: RIGHT TIBIA AND FIBULA - 2 VIEW; RIGHT FOOT COMPLETE - 3+ VIEW; RIGHT ANKLE - COMPLETE 3+ VIEW; DG HIP (WITH OR WITHOUT PELVIS) 2-3V RIGHT COMPARISON:  None. FINDINGS: Right hip: No acute  fracture or dislocation. Joint space is preserved. Unremarkable soft tissues. Right tibia-fibula: No acute fracture or malalignment. Alignment at the knee is maintained. No knee joint effusion. Unremarkable soft tissues. Right ankle: There is an acute mildly comminuted extra-articular fracture involving the lateral cortex of the lateral malleolus. Additional mildly comminuted fracture of the lateral process of the talus. Ankle mortise is congruent without widening. Mild soft tissue swelling overlies the lateral malleolus. Right foot: Findings suggesting a minimally displaced fracture of the anterior process  of the calcaneus. No dislocation. No additional fractures within the foot are clearly identified. There is soft tissue swelling of the hindfoot. Alignment at the Lisfranc joint is anatomic. IMPRESSION: 1. Acute fractures of the lateral malleolus and lateral process of the talus. 2. Probable fracture involving the anterior process of the calcaneus. 3. Right hip negative for fracture. Electronically Signed   By: Davina Poke M.D.   On: 01/13/2019 19:05   Dg Foot Complete Right  Result Date: 01/13/2019 CLINICAL DATA:  Right leg pain after fall EXAM: RIGHT TIBIA AND FIBULA - 2 VIEW; RIGHT FOOT COMPLETE - 3+ VIEW; RIGHT ANKLE - COMPLETE 3+ VIEW; DG HIP (WITH OR WITHOUT PELVIS) 2-3V RIGHT COMPARISON:  None. FINDINGS: Right hip: No acute fracture or dislocation. Joint space is preserved. Unremarkable soft tissues. Right tibia-fibula: No acute fracture or malalignment. Alignment at the knee is maintained. No knee joint effusion. Unremarkable soft tissues. Right ankle: There is an acute mildly comminuted extra-articular fracture involving the lateral cortex of the lateral malleolus. Additional mildly comminuted fracture of the lateral process of the talus. Ankle mortise is congruent without widening. Mild soft tissue swelling overlies the lateral malleolus. Right foot: Findings suggesting a minimally displaced  fracture of the anterior process of the calcaneus. No dislocation. No additional fractures within the foot are clearly identified. There is soft tissue swelling of the hindfoot. Alignment at the Lisfranc joint is anatomic. IMPRESSION: 1. Acute fractures of the lateral malleolus and lateral process of the talus. 2. Probable fracture involving the anterior process of the calcaneus. 3. Right hip negative for fracture. Electronically Signed   By: Davina Poke M.D.   On: 01/13/2019 19:05   Dg Hip Unilat W Or Wo Pelvis 2-3 Views Right  Result Date: 01/13/2019 CLINICAL DATA:  Right leg pain after fall EXAM: RIGHT TIBIA AND FIBULA - 2 VIEW; RIGHT FOOT COMPLETE - 3+ VIEW; RIGHT ANKLE - COMPLETE 3+ VIEW; DG HIP (WITH OR WITHOUT PELVIS) 2-3V RIGHT COMPARISON:  None. FINDINGS: Right hip: No acute fracture or dislocation. Joint space is preserved. Unremarkable soft tissues. Right tibia-fibula: No acute fracture or malalignment. Alignment at the knee is maintained. No knee joint effusion. Unremarkable soft tissues. Right ankle: There is an acute mildly comminuted extra-articular fracture involving the lateral cortex of the lateral malleolus. Additional mildly comminuted fracture of the lateral process of the talus. Ankle mortise is congruent without widening. Mild soft tissue swelling overlies the lateral malleolus. Right foot: Findings suggesting a minimally displaced fracture of the anterior process of the calcaneus. No dislocation. No additional fractures within the foot are clearly identified. There is soft tissue swelling of the hindfoot. Alignment at the Lisfranc joint is anatomic. IMPRESSION: 1. Acute fractures of the lateral malleolus and lateral process of the talus. 2. Probable fracture involving the anterior process of the calcaneus. 3. Right hip negative for fracture. Electronically Signed   By: Davina Poke M.D.   On: 01/13/2019 19:05     ____________________________________________    PROCEDURES  Procedure(s) performed:    .Splint Application  Date/Time: 01/13/2019 8:00 PM Performed by: Darletta Moll, PA-C Authorized by: Darletta Moll, PA-C   Consent:    Consent obtained:  Verbal   Consent given by:  Patient   Risks discussed:  Pain and swelling Pre-procedure details:    Sensation:  Normal Procedure details:    Laterality:  Right   Location:  Ankle   Ankle:  R ankle   Splint type:  Short leg and ankle stirrup  Supplies:  Cotton padding, Ortho-Glass and elastic bandage Post-procedure details:    Pain:  Improved   Sensation:  Normal   Patient tolerance of procedure:  Tolerated well, no immediate complications      Medications  oxyCODONE-acetaminophen (PERCOCET/ROXICET) 5-325 MG per tablet 1 tablet (1 tablet Oral Given 01/13/19 1857)  ondansetron (ZOFRAN-ODT) disintegrating tablet 8 mg (8 mg Oral Given 01/13/19 2008)     ____________________________________________   INITIAL IMPRESSION / ASSESSMENT AND PLAN / ED COURSE  Pertinent labs & imaging results that were available during my care of the patient were reviewed by me and considered in my medical decision making (see chart for details).  Review of the  [place] was performed in accordance of the [place] prior to dispensing any controlled drugs.  Clinical Course as of Jan 13 2347  Nancy Fetter Jan 13, 2019  1922 Patient presented complaining of right lower extremity pain after falling 3 steps landing on her feet.  It appears the patient is sustained both the lateral malleolus as well as a calcaneal fracture.  Patient is having no back pain but patient will have thoracic and lumbar spine films to ensure no compression fracture.   [JC]    Clinical Course User Index [JC] Hailea Eaglin, Charline Bills, PA-C          Patient's diagnosis is consistent with nondisplaced fracture of the right calcaneus, nondisplaced fracture of the right distal  fibula, right talus fracture.  Patient presented to the emergency department complaining of right lower extremity pain after falling through a wooden step on a porch.  Patient was ambulating down steps when the step gave way causing her to land on the ground below.  Patient did not hit her head or lose consciousness.  Her main complaint was right lower extremity pain.  After imaging revealed the above fractures, given calcaneal fracture even though patient was not experiencing back pain, thoracic and lumbar spine films were ordered which were reassuring at this time.  Patient will be prescribed pain medication, placed in a splint and will ambulate on a walker, being nonweightbearing on the right foot.  Patient is to follow-up with podiatry.. Patient is given ED precautions to return to the ED for any worsening or new symptoms.     ____________________________________________  FINAL CLINICAL IMPRESSION(S) / ED DIAGNOSES  Final diagnoses:  Closed nondisplaced fracture of anterior process of right calcaneus, initial encounter  Other closed fracture of distal end of right fibula, initial encounter  Closed nondisplaced fracture of body of right talus, initial encounter      NEW MEDICATIONS STARTED DURING THIS VISIT:  ED Discharge Orders         Ordered    HYDROcodone-acetaminophen (NORCO/VICODIN) 5-325 MG tablet  Every 4 hours PRN     01/13/19 2049              This chart was dictated using voice recognition software/Dragon. Despite best efforts to proofread, errors can occur which can change the meaning. Any change was purely unintentional.    Darletta Moll, PA-C 01/13/19 2348    Earleen Newport, MD 01/17/19 1053

## 2019-01-13 NOTE — ED Notes (Signed)
Pt put back in bed and told to elevate foot.

## 2019-01-30 ENCOUNTER — Ambulatory Visit: Payer: Medicaid Other | Admitting: Podiatry

## 2019-05-31 ENCOUNTER — Other Ambulatory Visit: Payer: Self-pay | Admitting: Internal Medicine

## 2019-05-31 DIAGNOSIS — Z1231 Encounter for screening mammogram for malignant neoplasm of breast: Secondary | ICD-10-CM

## 2019-12-11 ENCOUNTER — Ambulatory Visit: Payer: Medicaid Other | Admitting: Podiatry

## 2019-12-16 ENCOUNTER — Other Ambulatory Visit: Payer: Self-pay

## 2019-12-16 ENCOUNTER — Emergency Department: Payer: Medicaid Other

## 2019-12-16 ENCOUNTER — Encounter: Payer: Self-pay | Admitting: Emergency Medicine

## 2019-12-16 ENCOUNTER — Emergency Department
Admission: EM | Admit: 2019-12-16 | Discharge: 2019-12-16 | Disposition: A | Discharge status: Home / Self Care | Payer: Medicaid Other | Attending: Emergency Medicine | Admitting: Emergency Medicine

## 2019-12-16 DIAGNOSIS — Z87891 Personal history of nicotine dependence: Secondary | ICD-10-CM | POA: Insufficient documentation

## 2019-12-16 DIAGNOSIS — I509 Heart failure, unspecified: Secondary | ICD-10-CM | POA: Insufficient documentation

## 2019-12-16 DIAGNOSIS — Y998 Other external cause status: Secondary | ICD-10-CM | POA: Diagnosis not present

## 2019-12-16 DIAGNOSIS — Z79899 Other long term (current) drug therapy: Secondary | ICD-10-CM | POA: Insufficient documentation

## 2019-12-16 DIAGNOSIS — W01198A Fall on same level from slipping, tripping and stumbling with subsequent striking against other object, initial encounter: Secondary | ICD-10-CM | POA: Diagnosis not present

## 2019-12-16 DIAGNOSIS — I2510 Atherosclerotic heart disease of native coronary artery without angina pectoris: Secondary | ICD-10-CM | POA: Insufficient documentation

## 2019-12-16 DIAGNOSIS — S20212A Contusion of left front wall of thorax, initial encounter: Secondary | ICD-10-CM | POA: Diagnosis not present

## 2019-12-16 DIAGNOSIS — Y9301 Activity, walking, marching and hiking: Secondary | ICD-10-CM | POA: Insufficient documentation

## 2019-12-16 DIAGNOSIS — E1159 Type 2 diabetes mellitus with other circulatory complications: Secondary | ICD-10-CM | POA: Diagnosis not present

## 2019-12-16 DIAGNOSIS — Y929 Unspecified place or not applicable: Secondary | ICD-10-CM | POA: Diagnosis not present

## 2019-12-16 DIAGNOSIS — I110 Hypertensive heart disease with heart failure: Secondary | ICD-10-CM | POA: Insufficient documentation

## 2019-12-16 DIAGNOSIS — S299XXA Unspecified injury of thorax, initial encounter: Secondary | ICD-10-CM | POA: Diagnosis present

## 2019-12-16 DIAGNOSIS — J45909 Unspecified asthma, uncomplicated: Secondary | ICD-10-CM | POA: Diagnosis not present

## 2019-12-16 DIAGNOSIS — Z91040 Latex allergy status: Secondary | ICD-10-CM | POA: Diagnosis not present

## 2019-12-16 MED ORDER — MELOXICAM 7.5 MG PO TABS
15.0000 mg | ORAL_TABLET | Freq: Once | ORAL | Status: AC
  Administered 2019-12-16: 15 mg via ORAL
  Filled 2019-12-16: qty 2

## 2019-12-16 NOTE — ED Provider Notes (Signed)
[facility] Emergency Department Provider Note  ____________________________________________  Time seen: Approximately 5:10 PM  I have reviewed the triage vital signs and the nursing notes.   HISTORY  Chief Complaint Fall and Rib Injury    HPI Desiree Cruz is a 49 y.o. female who presents the emergency department complaining of left rib pain.  Patient states that she was walking her dog when the dog accidentally tripped her.  She fell on her left side.  Initially no complaints the patient developed some lateral rib pain.  No shortness of breath, cough, difficulty breathing.  No abdominal pain.  She did not hit her head or lose consciousness.  No medications prior to arrival.  She has a history as described below with no complaints with chronic medical issues.  Patient was concerned she may have broken a rib.         Past Medical History:  Diagnosis Date  . Asthma   . CHF (congestive heart failure) ([place])   . Coronary artery disease   . Diabetes mellitus without complication ([employer])   . Hypertension   . Inverted nipple    recent Bil nipple inversion 03/18  . Migraines     Patient Active Problem List   Diagnosis Date Noted  . Depression 12/27/2017  . Diabetic polyneuropathy associated with type 2 diabetes mellitus ([place]) 11/10/2017  . Tobacco use disorder 05/16/2013  . Nausea 12/24/2012  . Sleep disturbance 12/24/2012  . Major depressive disorder, recurrent episode ([place]) 12/08/2011  . Posttraumatic stress disorder 12/08/2011    Past Surgical History:  Procedure Laterality Date  . ABDOMINAL HYSTERECTOMY      Prior to Admission medications   Medication Sig Start Date End Date Taking? Authorizing Provider  albuterol (PROVENTIL HFA;VENTOLIN HFA) 108 (90 Base) MCG/ACT inhaler Inhale 2 puffs into the lungs every 6 (six) hours as needed for wheezing or shortness of breath. 05/25/17   Fisher, Linden Dolin, PA-C  atorvastatin (LIPITOR) 40 MG tablet Take 40  mg by mouth daily.    [provider]  Blood Glucose Monitoring Suppl (ACCU-CHEK GUIDE) w/Device KIT See admin instructions. 11/29/17   [provider]  Cholecalciferol (VITAMIN D3) 50000 units CAPS Take 1 capsule by mouth once a week. 11/10/17   [provider]  cyclobenzaprine (FLEXERIL) 5 MG tablet Take 1 tablet (5 mg total) by mouth 3 (three) times daily as needed for muscle spasms. 06/07/18   Menshew, Dannielle Karvonen, PA-C  diphenhydrAMINE (BENADRYL) 25 mg capsule Take 1 capsule (25 mg total) by mouth every 4 (four) hours as needed. 02/19/18 02/19/19  Laban Emperor, PA-C  furosemide (LASIX) 20 MG tablet Take 20 mg by mouth daily. 11/29/17   [provider]  gabapentin (NEURONTIN) 300 MG capsule Take 300 mg by mouth 2 (two) times daily.    [provider]  HYDROcodone-acetaminophen (NORCO/VICODIN) 5-325 MG tablet Take 1 tablet by mouth every 4 (four) hours as needed for moderate pain. 01/13/19   Zala Degrasse, Charline Bills, PA-C  ibuprofen (ADVIL) 600 MG tablet Take 1 tablet (600 mg total) by mouth every 8 (eight) hours as needed. 10/13/18   Sable Feil, PA-C  ketorolac (TORADOL) 10 MG tablet Take 1 tablet (10 mg total) by mouth every 8 (eight) hours. 06/07/18   Menshew, Dannielle Karvonen, PA-C  loratadine (CLARITIN) 10 MG tablet Take 10 mg by mouth daily.    [provider]  montelukast (SINGULAIR) 10 MG tablet Take by mouth.    [provider]  ondansetron (ZOFRAN) 4 MG tablet Take 4 mg by mouth every 8 (eight) hours as needed for nausea or vomiting.    [provider]  pravastatin (PRAVACHOL) 40 MG tablet Take 40 mg by mouth at bedtime. 10/04/17   [provider]  predniSONE (DELTASONE) 10 MG tablet Take 6 tablets on day 1, take 5 tablets on day 2, take 4 tablets on day 3, take 3 tablets on day 4, take 2 tablets on day 5, take 1 tablet on day 6 02/19/18   Enid Derry, PA-C  ranitidine (ZANTAC) 150 MG tablet Take 1 tablet (150  mg total) by mouth 2 (two) times daily. 02/19/18 02/19/19  Enid Derry, PA-C  traZODone (DESYREL) 100 MG tablet TAKE 1 TABLET BY MOUTH 1 HOUR BEFORE BEDTIME 11/30/17   [provider]    Allergies Latex and Penicillins  Family History  Problem Relation Age of Onset  . Breast cancer Other     Social History Social History   Tobacco Use  . Smoking status: Former Smoker    Packs/day: 0.50    Types: Cigarettes  . Smokeless tobacco: Never Used  Substance Use Topics  . Alcohol use: No    Alcohol/week: 0.0 standard drinks  . Drug use: Never     Review of Systems  Constitutional: No fever/chills Eyes: No visual changes. No discharge ENT: No upper respiratory complaints. Cardiovascular: no chest pain. Respiratory: no cough. No SOB. Gastrointestinal: No abdominal pain.  No nausea, no vomiting.  No diarrhea.  No constipation. Genitourinary: Negative for dysuria. No hematuria Musculoskeletal: Left lateral rib pain following a fall Skin: Negative for rash, abrasions, lacerations, ecchymosis. Neurological: Negative for headaches, focal weakness or numbness. 10-point ROS otherwise negative.  ____________________________________________   PHYSICAL EXAM:  VITAL SIGNS: ED Triage Vitals  Enc Vitals Group     BP 12/16/19 1422 (!) 112/57     Pulse Rate 12/16/19 1422 88     Resp 12/16/19 1422 20     Temp 12/16/19 1422 98.6 F (37 C)     Temp Source 12/16/19 1422 Oral     SpO2 12/16/19 1422 95 %     Weight 12/16/19 1420 245 lb (111.1 kg)     Height 12/16/19 1420 5\' 9"  (1.753 m)     Head Circumference --      Peak Flow --      Pain Score 12/16/19 1419 10     Pain Loc --      Pain Edu? --      Excl. in GC? --      Constitutional: Alert and oriented. Well appearing and in no acute distress. Eyes: Conjunctivae are normal. PERRL. EOMI. Head: Atraumatic. ENT:      Ears:       Nose: No congestion/rhinnorhea.      Mouth/Throat: Mucous membranes are moist.  Neck: No  stridor.    Cardiovascular: Normal rate, regular rhythm. Normal S1 and S2.  Good peripheral circulation. Respiratory: Normal respiratory effort without tachypnea or retractions. Lungs CTAB. Good air entry to the bases with no decreased or absent breath sounds. Gastrointestinal: Bowel sounds 4 quadrants. Soft and nontender to palpation. No guarding or rigidity. No palpable masses. No distention. No CVA tenderness. Musculoskeletal: Full range of motion to all extremities. No gross deformities appreciated.  Visualization of the ribs reveals no visible signs of trauma with ecchymosis, abrasions, deformities.  No paradoxical chest wall movement.  Palpation reveals tenderness over the anterolateral ribs around rib 8 and 9.  No  palpable abnormality in this area.  No subcutaneous emphysema.  Good underlying breath sounds bilaterally. Neurologic:  Normal speech and language. No gross focal neurologic deficits are appreciated.  Skin:  Skin is warm, dry and intact. No rash noted. Psychiatric: Mood and affect are normal. Speech and behavior are normal. Patient exhibits appropriate insight and judgement.   ____________________________________________   LABS (all labs ordered are listed, but only abnormal results are displayed)  Labs Reviewed - No data to display ____________________________________________  EKG   ____________________________________________  RADIOLOGY I personally viewed and evaluated these images as part of my medical decision making, as well as reviewing the written report by the radiologist.  DG Ribs Unilateral W/Chest Left  Result Date: 12/16/2019 CLINICAL DATA:  Left-sided rib pain after fall EXAM: LEFT RIBS AND CHEST - 3+ VIEW COMPARISON:  05/25/2017 FINDINGS: No fracture or other bone lesions are seen involving the ribs. There is no evidence of pneumothorax or pleural effusion. Both lungs are clear. Heart size and mediastinal contours are within normal limits. IMPRESSION:  Negative. Electronically Signed   By: Davina Poke D.O.   On: 12/16/2019 15:19    ____________________________________________    PROCEDURES  Procedure(s) performed:    Procedures    Medications  meloxicam (MOBIC) tablet 15 mg (has no administration in time range)     ____________________________________________   INITIAL IMPRESSION / ASSESSMENT AND PLAN / ED COURSE  Pertinent labs & imaging results that were available during my care of the patient were reviewed by me and considered in my medical decision making (see chart for details).  Review of the [place] CSRS was performed in accordance of the [place] prior to dispensing any controlled drugs.           Patient's diagnosis is consistent with fall, rib contusion.  Patient presents emergency department complaining of rib pain after a mechanical fall yesterday.  Initially ribs did not hurt but patient has developed worsening for lateral rib pain since.  No shortness of breath or cough.  Overall exam was reassuring.  Imaging revealed no acute rib fractures.  No underlying pneumothorax.  Patient had no abdominal trauma or pain at this time.  No indication for further work-up.  Patient replaced on meloxicam for symptom improvement.  Follow-up primary care as needed..  Patient is given ED precautions to return to the ED for any worsening or new symptoms.     ____________________________________________  FINAL CLINICAL IMPRESSION(S) / ED DIAGNOSES  Final diagnoses:  Contusion of rib on left side, initial encounter      NEW MEDICATIONS STARTED DURING THIS VISIT:  ED Discharge Orders    None          This chart was dictated using voice recognition software/Dragon. Despite best efforts to proofread, errors can occur which can change the meaning. Any change was purely unintentional.    Darletta Moll, PA-C 12/16/19 1723    Nance Pear, MD 12/16/19 [phone]

## 2019-12-16 NOTE — ED Triage Notes (Signed)
Pt reports her dog tripped her last pm and she fell hurting her left ribs.

## 2020-01-08 ENCOUNTER — Ambulatory Visit: Payer: Medicaid Other | Admitting: Podiatry

## 2020-01-08 ENCOUNTER — Encounter: Payer: Self-pay | Admitting: Podiatry

## 2020-01-08 ENCOUNTER — Other Ambulatory Visit: Payer: Self-pay

## 2020-01-08 ENCOUNTER — Ambulatory Visit (INDEPENDENT_AMBULATORY_CARE_PROVIDER_SITE_OTHER): Payer: Medicaid Other

## 2020-01-08 DIAGNOSIS — S9031XA Contusion of right foot, initial encounter: Secondary | ICD-10-CM

## 2020-01-08 NOTE — Progress Notes (Signed)
Subjective:  Patient ID: Desiree Cruz, female    DOB: [birth year],  MRN: [id number] HPI Chief Complaint  Patient presents with   Foot Pain    Patient presents today for lateral side ankle/foot pain right foot.  She says she broke foot/ankle last September and since then her foot constanty hurts and swells.  She says it throbs at night and is painful to walk.  She has tried ankle brace, Ibuprofen, Tylenol, ice, heat with no relief    49 y.o. female presents with the above complaint.   ROS: Denies fever chills nausea vomiting muscle aches pains calf pain back pain chest pain shortness of breath.  Past Medical History:  Diagnosis Date   Asthma    CHF (congestive heart failure) ([facility])    Coronary artery disease    Diabetes mellitus without complication ([place])    Hypertension    Inverted nipple    recent Bil nipple inversion 03/18   Migraines    Past Surgical History:  Procedure Laterality Date   ABDOMINAL HYSTERECTOMY      Current Outpatient Medications:    nitroGLYCERIN (NITROSTAT) 0.4 MG SL tablet, DISSOLVE ONE TABLET UNDER THE TONGUE EVERY 5 MINUTES AS NEEDED FOR CHEST PAIN. DO NOT EXCEED A TOTAL OF 3 DOSES IN 15 MINUTES, Disp: , Rfl:    albuterol (PROVENTIL HFA;VENTOLIN HFA) 108 (90 Base) MCG/ACT inhaler, Inhale 2 puffs into the lungs every 6 (six) hours as needed for wheezing or shortness of breath., Disp: 1 Inhaler, Rfl: 2   ARTIFICIAL TEARS 1 % ophthalmic solution, Apply to eye., Disp: , Rfl:    atorvastatin (LIPITOR) 40 MG tablet, Take 40 mg by mouth daily., Disp: , Rfl:    Blood Glucose Monitoring Suppl (ACCU-CHEK GUIDE) w/Device KIT, See admin instructions., Disp: , Rfl: 0   diphenhydrAMINE (BENADRYL) 25 mg capsule, Take 1 capsule (25 mg total) by mouth every 4 (four) hours as needed., Disp: 30 capsule, Rfl: 0   gabapentin (NEURONTIN) 300 MG capsule, Take 300 mg by mouth 2 (two) times daily., Disp: , Rfl:    ibuprofen (ADVIL) 600 MG tablet, Take 1  tablet (600 mg total) by mouth every 8 (eight) hours as needed., Disp: 15 tablet, Rfl: 0   ketorolac (TORADOL) 10 MG tablet, Take 1 tablet (10 mg total) by mouth every 8 (eight) hours., Disp: 15 tablet, Rfl: 0   loratadine (CLARITIN) 10 MG tablet, Take 10 mg by mouth daily., Disp: , Rfl:    montelukast (SINGULAIR) 10 MG tablet, Take by mouth., Disp: , Rfl:    pantoprazole (PROTONIX) 40 MG tablet, Take 40 mg by mouth daily., Disp: , Rfl:    pravastatin (PRAVACHOL) 40 MG tablet, Take 40 mg by mouth at bedtime., Disp: , Rfl: 1   RESTASIS MULTIDOSE 0.05 % ophthalmic emulsion, , Disp: , Rfl:    sertraline (ZOLOFT) 100 MG tablet, Take 100 mg by mouth daily., Disp: , Rfl:    traZODone (DESYREL) 100 MG tablet, TAKE 1 TABLET BY MOUTH 1 HOUR BEFORE BEDTIME, Disp: , Rfl: 2  Allergies  Allergen Reactions   Latex Rash   Penicillins Rash   Review of Systems Objective:  There were no vitals filed for this visit.  General: Well developed, nourished, in no acute distress, alert and oriented x3   Dermatological: Skin is warm, dry and supple bilateral. Nails x 10 are well maintained; remaining integument appears unremarkable at this time. There are no open sores, no preulcerative lesions, no rash or signs of infection present.  Vascular: Dorsalis Pedis artery and Posterior Tibial artery pedal pulses are 2/4 bilateral with immedate capillary fill time. Pedal hair growth present. No varicosities and no lower extremity edema present bilateral.   Neruologic: Grossly intact via light touch bilateral. Vibratory intact via tuning fork bilateral. Protective threshold with Semmes Wienstein monofilament intact to all pedal sites bilateral. Patellar and Achilles deep tendon reflexes 2+ bilateral. No Babinski or clonus noted bilateral.   Musculoskeletal: No gross boney pedal deformities bilateral. No pain, crepitus, or limitation noted with foot and ankle range of motion bilateral. Muscular strength 5/5 in all  groups tested bilateral.  She has severe pain on palpation of the lateral ankle right.  Is warm to the touch it is swollen is nonpitting the fibula itself is tender but she has tenderness along the anterior talofibular ligament calcaneofibular ligament and the peroneal tendons.  She has tenderness on range of motion of the ankle joint and on inversion and eversion of the midtarsal joint.  Gait: Unassisted, antalgic gait right side   Radiographs:  Radiographs taken today demonstrate osteoarthritic changes of the calcaneocuboid joint subtalar joint talonavicular joint also demonstrates what appears to be a old fracture to the fibula nondisplaced noncomminuted but there is a small distal fracture fragment.  Assessment & Plan:   Assessment: Probable ankle disruption from her traumatic injury that occurred last September.  Cannot rule out osteoarthritic changes nonunion or even ligamentous injury.  Plan: Discussed etiology pathology conservative versus surgical therapies.  I went ahead and schedule her MRI of that right foot since she can barely walk on it and I will follow-up with her once that comes in.  At this point I did not do anything as far as medication.  She is requesting an injection we declined at this time until we know what is going on.     Kaesha Kirsch T. [place], [place]

## 2020-01-10 ENCOUNTER — Encounter: Payer: Self-pay | Admitting: *Deleted

## 2020-01-10 ENCOUNTER — Telehealth: Payer: Self-pay | Admitting: Podiatry

## 2020-01-10 NOTE — Telephone Encounter (Signed)
I spoke with patient and informed her that I will need a copy of her insurance card to precert for MRI.  She will stop by office to give [place] a copy of card.

## 2020-01-10 NOTE — Progress Notes (Signed)
Desiree Cruz was informed at her last visit that her [employer] Medicaid is not in-network with [facility].  We asked her to call Medicaid and see if she could get it changed to [facility], [employer], or [employer].  She's supposed to have a MRI.  Chandra tried to inform the patient that her insurance was out of network for the MRI.  Desiree Cruz brought over the insurance card.  She stated that [facility] would not accept her insurance card the last visit she was here.   I informed her that it was out of network.  I told her I would enter it but stressed again that we are out of network.   I gave her the list of [employer] that we are in-network with which are [facility], [employer] and [employer].  The patient said she would call Medicaid.

## 2020-01-10 NOTE — Telephone Encounter (Signed)
Pt called asking about her MRI appointment. She has not heard anything and we did not see an order in the chart. Please follow up.

## 2020-01-28 ENCOUNTER — Telehealth: Payer: Self-pay

## 2020-01-28 DIAGNOSIS — S9031XA Contusion of right foot, initial encounter: Secondary | ICD-10-CM

## 2020-01-28 NOTE — Telephone Encounter (Signed)
MRI has been approved from 01/27/2020 to 03/12/2020  Auth # [id number]  Patient has been notified and will call scheduling dept to set up appt to her convenience.

## 2020-02-13 ENCOUNTER — Ambulatory Visit
Admission: RE | Admit: 2020-02-13 | Discharge: 2020-02-13 | Disposition: A | Discharge status: Home / Self Care | Payer: Medicaid Other | Source: Ambulatory Visit | Attending: Podiatry | Admitting: Podiatry

## 2020-02-13 ENCOUNTER — Other Ambulatory Visit: Payer: Self-pay

## 2020-02-13 DIAGNOSIS — S9031XA Contusion of right foot, initial encounter: Secondary | ICD-10-CM | POA: Diagnosis not present

## 2020-03-02 ENCOUNTER — Encounter: Payer: Self-pay | Admitting: Podiatry

## 2020-03-02 ENCOUNTER — Other Ambulatory Visit: Payer: Self-pay

## 2020-03-02 ENCOUNTER — Ambulatory Visit: Payer: Medicaid Other | Admitting: Podiatry

## 2020-03-02 DIAGNOSIS — M7751 Other enthesopathy of right foot: Secondary | ICD-10-CM | POA: Diagnosis not present

## 2020-03-02 DIAGNOSIS — Q688 Other specified congenital musculoskeletal deformities: Secondary | ICD-10-CM | POA: Diagnosis not present

## 2020-03-02 DIAGNOSIS — M7671 Peroneal tendinitis, right leg: Secondary | ICD-10-CM

## 2020-03-02 NOTE — Progress Notes (Signed)
She presents today to go over her MRI.  She states that feet are still just killing me.  Objective: Vital signs are stable she is alert oriented x3.  MRI does indicate subtalar joint arthritis right and os trigonum right.  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  Plan: I injected both of these areas today 20 mg Kenalog 5 mg Marcaine for maximal tenderness.  Tolerated seizure well without complications follow-up with her as needed.

## 2020-04-13 ENCOUNTER — Ambulatory Visit: Payer: Medicaid Other | Admitting: Podiatry

## 2020-06-04 ENCOUNTER — Other Ambulatory Visit: Payer: Self-pay

## 2020-06-04 ENCOUNTER — Emergency Department
Admission: EM | Admit: 2020-06-04 | Discharge: 2020-06-04 | Disposition: A | Discharge status: Home / Self Care | Payer: Medicaid Other | Attending: Emergency Medicine | Admitting: Emergency Medicine

## 2020-06-04 ENCOUNTER — Emergency Department: Payer: Medicaid Other

## 2020-06-04 DIAGNOSIS — Z91040 Latex allergy status: Secondary | ICD-10-CM | POA: Diagnosis not present

## 2020-06-04 DIAGNOSIS — Z79899 Other long term (current) drug therapy: Secondary | ICD-10-CM | POA: Diagnosis not present

## 2020-06-04 DIAGNOSIS — E1142 Type 2 diabetes mellitus with diabetic polyneuropathy: Secondary | ICD-10-CM | POA: Insufficient documentation

## 2020-06-04 DIAGNOSIS — I509 Heart failure, unspecified: Secondary | ICD-10-CM | POA: Diagnosis not present

## 2020-06-04 DIAGNOSIS — W541XXA Struck by dog, initial encounter: Secondary | ICD-10-CM | POA: Insufficient documentation

## 2020-06-04 DIAGNOSIS — S93401A Sprain of unspecified ligament of right ankle, initial encounter: Secondary | ICD-10-CM | POA: Diagnosis not present

## 2020-06-04 DIAGNOSIS — J45909 Unspecified asthma, uncomplicated: Secondary | ICD-10-CM | POA: Diagnosis not present

## 2020-06-04 DIAGNOSIS — I110 Hypertensive heart disease with heart failure: Secondary | ICD-10-CM | POA: Diagnosis not present

## 2020-06-04 DIAGNOSIS — Z87891 Personal history of nicotine dependence: Secondary | ICD-10-CM | POA: Insufficient documentation

## 2020-06-04 DIAGNOSIS — S99911A Unspecified injury of right ankle, initial encounter: Secondary | ICD-10-CM | POA: Diagnosis present

## 2020-06-04 MED ORDER — OXYCODONE-ACETAMINOPHEN 7.5-325 MG PO TABS: 1.0000 | ORAL_TABLET | Freq: Four times a day (QID) | ORAL | 0 refills | Status: DC | PRN

## 2020-06-04 MED ORDER — OXYCODONE-ACETAMINOPHEN 5-325 MG PO TABS
1.0000 | ORAL_TABLET | Freq: Once | ORAL | Status: AC
  Administered 2020-06-04: 1 via ORAL
  Filled 2020-06-04: qty 1

## 2020-06-04 NOTE — Discharge Instructions (Addendum)
No acute findings on x-ray of the right ankle. Follow discharge care instruction. Wear splint for 3 to 5 days as needed.

## 2020-06-04 NOTE — ED Triage Notes (Signed)
Pt states a dog knocked her down and she twisted her right ankle

## 2020-06-04 NOTE — ED Provider Notes (Signed)
[facility] Emergency Department Provider Note   ____________________________________________   Event Date/Time   First MD Initiated Contact with Patient 06/04/20 1311     (approximate)  I have reviewed the triage vital signs and the nursing notes.   HISTORY  Chief Complaint Ankle Pain    HPI Desiree Cruz is a 50 y.o. female complaint of right ankle pain secondary to twisting incident and fall. Patient states she was knocked down by dog. Incident occurred prior to arrival. Patient has long history of right ankle pain secondary to degenerative changes and is followed by a podiatrist.         Past Medical History:  Diagnosis Date  . Asthma   . CHF (congestive heart failure) ([place])   . Coronary artery disease   . Diabetes mellitus without complication ([place])   . Hypertension   . Inverted nipple    recent Bil nipple inversion 03/18  . Migraines     Patient Active Problem List   Diagnosis Date Noted  . Depression 12/27/2017  . Diabetic polyneuropathy associated with type 2 diabetes mellitus ([place]) 11/10/2017  . Tobacco use disorder 05/16/2013  . Nausea 12/24/2012  . Sleep disturbance 12/24/2012  . Major depressive disorder, recurrent episode ([place]) 12/08/2011  . Posttraumatic stress disorder 12/08/2011    Past Surgical History:  Procedure Laterality Date  . ABDOMINAL HYSTERECTOMY      Prior to Admission medications   Medication Sig Start Date End Date Taking? Authorizing Provider  oxyCODONE-acetaminophen (PERCOCET) 7.5-325 MG tablet Take 1 tablet by mouth every 6 (six) hours as needed for severe pain. 06/04/20  Yes Sable Feil, PA-C  albuterol (PROVENTIL HFA;VENTOLIN HFA) 108 (90 Base) MCG/ACT inhaler Inhale 2 puffs into the lungs every 6 (six) hours as needed for wheezing or shortness of breath. 05/25/17   Fisher, Linden Dolin, PA-C  ARTIFICIAL TEARS 1 % ophthalmic solution Apply to eye. 11/14/19   [provider]  atorvastatin  (LIPITOR) 40 MG tablet Take 40 mg by mouth daily.    [provider]  Blood Glucose Monitoring Suppl (ACCU-CHEK GUIDE) w/Device KIT See admin instructions. 11/29/17   [provider]  diphenhydrAMINE (BENADRYL) 25 mg capsule Take 1 capsule (25 mg total) by mouth every 4 (four) hours as needed. 02/19/18 02/19/19  Laban Emperor, PA-C  gabapentin (NEURONTIN) 300 MG capsule Take 300 mg by mouth 2 (two) times daily.    [provider]  ibuprofen (ADVIL) 600 MG tablet Take 1 tablet (600 mg total) by mouth every 8 (eight) hours as needed. 10/13/18   Sable Feil, PA-C  ketorolac (TORADOL) 10 MG tablet Take 1 tablet (10 mg total) by mouth every 8 (eight) hours. 06/07/18   Menshew, Dannielle Karvonen, PA-C  loratadine (CLARITIN) 10 MG tablet Take 10 mg by mouth daily.    [provider]  montelukast (SINGULAIR) 10 MG tablet Take by mouth.    [provider]  nitroGLYCERIN (NITROSTAT) 0.4 MG SL tablet DISSOLVE ONE TABLET UNDER THE TONGUE EVERY 5 MINUTES AS NEEDED FOR CHEST PAIN. DO NOT EXCEED A TOTAL OF 3 DOSES IN 15 MINUTES 02/19/18   [provider]  pantoprazole (PROTONIX) 40 MG tablet Take 40 mg by mouth daily. 12/27/19   [provider]  pravastatin (PRAVACHOL) 40 MG tablet Take 40 mg by mouth at bedtime. 10/04/17   [provider]  RESTASIS MULTIDOSE 0.05 % ophthalmic emulsion  12/11/19   [provider]  sertraline (ZOLOFT) 100 MG tablet  Take 100 mg by mouth daily. 12/27/19   [provider]  traZODone (DESYREL) 100 MG tablet TAKE 1 TABLET BY MOUTH 1 HOUR BEFORE BEDTIME 11/30/17   [provider]    Allergies Latex and Penicillins  Family History  Problem Relation Age of Onset  . Breast cancer Other     Social History Social History   Tobacco Use  . Smoking status: Former Smoker    Packs/day: 0.50    Types: Cigarettes  . Smokeless tobacco: Never Used  Substance Use Topics  . Alcohol use: No     Alcohol/week: 0.0 standard drinks  . Drug use: Never    Review of Systems Constitutional: No fever/chills Eyes: No visual changes. ENT: No sore throat. Cardiovascular: Denies chest pain. Respiratory: Denies shortness of breath. Gastrointestinal: No abdominal pain.  No nausea, no vomiting.  No diarrhea.  No constipation. Genitourinary: Negative for dysuria. Musculoskeletal: Right lateral ankle pain. Skin: Negative for rash. Neurological: Negative for headaches, focal weakness or numbness. Allergic/Immunilogical: Latex/ penicillin ____________________________________________   PHYSICAL EXAM:  VITAL SIGNS: ED Triage Vitals  Enc Vitals Group     BP 06/04/20 1313 (!) 130/91     Pulse Rate 06/04/20 1313 (!) 107     Resp 06/04/20 1313 17     Temp 06/04/20 1313 98.3 F (36.8 C)     Temp Source 06/04/20 1313 Oral     SpO2 06/04/20 1313 96 %     Weight 06/04/20 1307 251 lb (113.9 kg)     Height 06/04/20 1307 _0  (1.753 m)     Head Circumference --      Peak Flow --      Pain Score 06/04/20 1307 10     Pain Loc --      Pain Edu? --      Excl. in [place]? --    Constitutional: Alert and oriented. Well appearing and in no acute distress. Eyes: Conjunctivae are normal. PERRL. EOMI. Head: Atraumatic. Nose: No congestion/rhinnorhea. Mouth/Throat: Mucous membranes are moist.  Oropharynx non-erythematous. Neck: No stridor. No cervical spine tenderness to palpation. Hematological/Lymphatic/Immunilogical: No cervical lymphadenopathy. Cardiovascular: Normal rate, regular rhythm. Grossly normal heart sounds.  Good peripheral circulation. Respiratory: Normal respiratory effort.  No retractions. Lungs CTAB. Musculoskeletal: No lower extremity tenderness nor edema.  No joint effusions. Neurologic:  Normal speech and language. No gross focal neurologic deficits are appreciated. No gait instability. Skin:  Skin is warm, dry and intact. No rash noted.  Abrasion right lateral ankle. Psychiatric:  Mood and affect are normal. Speech and behavior are normal.  ____________________________________________   LABS (all labs ordered are listed, but only abnormal results are displayed)  Labs Reviewed - No data to display ____________________________________________  EKG   ____________________________________________  RADIOLOGY I, Sable Feil, personally viewed and evaluated these images (plain radiographs) as part of my medical decision making, as well as reviewing the written report by the radiologist.  ED MD interpretation: Moderate degenerative changes of the right ankle.  No acute fracture.  Official radiology report(s): DG Ankle Complete Right  Result Date: 06/04/2020 CLINICAL DATA:  Pain status post injury EXAM: RIGHT ANKLE - COMPLETE 3+ VIEW COMPARISON:  None. FINDINGS: No acute fracture or dislocation. Degenerative spurring noted along the lateral cortex of the talus. Mild soft tissue swelling of the lateral ankle. IMPRESSION: No acute fracture or dislocation. If the patient has continued symptoms, repeat radiographs in 10-14 days. Electronically Signed   By: Miachel Roux M.D.   On: 06/04/2020 13:36  ____________________________________________   PROCEDURES  Procedure(s) performed (including Critical Care):  Procedures   ____________________________________________   INITIAL IMPRESSION / ASSESSMENT AND PLAN / ED COURSE  As part of my medical decision making, I reviewed the following data within the [place]         Right ankle pain secondary to sprain.  Discussed x-ray findings with patient.  Patient placed in a Velcro splint and open shoe.  Patient given discharge care instruction advised to follow-up with podiatry.  Patient advised on drug effects of pain medication.      ____________________________________________   FINAL CLINICAL IMPRESSION(S) / ED DIAGNOSES  Final diagnoses:  Sprain of right ankle, unspecified ligament,  initial encounter     ED Discharge Orders         Ordered    oxyCODONE-acetaminophen (PERCOCET) 7.5-325 MG tablet  Every 6 hours PRN        06/04/20 1347          *Please note:  Desiree Cruz was evaluated in Emergency Department on 06/04/2020 for the symptoms described in the history of present illness. She was evaluated in the context of the global COVID-19 pandemic, which necessitated consideration that the patient might be at risk for infection with the SARS-CoV-2 virus that causes COVID-19. Institutional protocols and algorithms that pertain to the evaluation of patients at risk for COVID-19 are in a state of rapid change based on information released by regulatory bodies including the CDC and federal and state organizations. These policies and algorithms were followed during the patient's care in the ED.  Some ED evaluations and interventions may be delayed as a result of limited staffing during and the pandemic.*   Note:  This document was prepared using Dragon voice recognition software and may include unintentional dictation errors.    Sable Feil, PA-C 06/04/20 1404    Vanessa Shishmaref, MD 06/05/20 [phone]

## 2021-01-25 ENCOUNTER — Other Ambulatory Visit: Payer: Self-pay

## 2021-01-25 ENCOUNTER — Emergency Department
Admission: EM | Admit: 2021-01-25 | Discharge: 2021-01-25 | Disposition: A | Discharge status: Home / Self Care | Payer: Medicaid Other | Attending: Emergency Medicine | Admitting: Emergency Medicine

## 2021-01-25 DIAGNOSIS — I509 Heart failure, unspecified: Secondary | ICD-10-CM | POA: Diagnosis not present

## 2021-01-25 DIAGNOSIS — E1142 Type 2 diabetes mellitus with diabetic polyneuropathy: Secondary | ICD-10-CM | POA: Insufficient documentation

## 2021-01-25 DIAGNOSIS — X58XXXA Exposure to other specified factors, initial encounter: Secondary | ICD-10-CM | POA: Diagnosis not present

## 2021-01-25 DIAGNOSIS — Z91040 Latex allergy status: Secondary | ICD-10-CM | POA: Insufficient documentation

## 2021-01-25 DIAGNOSIS — Z87891 Personal history of nicotine dependence: Secondary | ICD-10-CM | POA: Diagnosis not present

## 2021-01-25 DIAGNOSIS — N3000 Acute cystitis without hematuria: Secondary | ICD-10-CM

## 2021-01-25 DIAGNOSIS — J45909 Unspecified asthma, uncomplicated: Secondary | ICD-10-CM | POA: Insufficient documentation

## 2021-01-25 DIAGNOSIS — S3992XA Unspecified injury of lower back, initial encounter: Secondary | ICD-10-CM | POA: Diagnosis present

## 2021-01-25 DIAGNOSIS — S39012A Strain of muscle, fascia and tendon of lower back, initial encounter: Secondary | ICD-10-CM | POA: Diagnosis not present

## 2021-01-25 DIAGNOSIS — I110 Hypertensive heart disease with heart failure: Secondary | ICD-10-CM | POA: Insufficient documentation

## 2021-01-25 DIAGNOSIS — I2510 Atherosclerotic heart disease of native coronary artery without angina pectoris: Secondary | ICD-10-CM | POA: Diagnosis not present

## 2021-01-25 LAB — URINALYSIS, ROUTINE W REFLEX MICROSCOPIC
Bilirubin Urine: NEGATIVE
Glucose, UA: NEGATIVE mg/dL
Hgb urine dipstick: NEGATIVE
Ketones, ur: NEGATIVE mg/dL
Leukocytes,Ua: NEGATIVE
Nitrite: NEGATIVE
Protein, ur: NEGATIVE mg/dL
Specific Gravity, Urine: 1.008 (ref 1.005–1.030)
pH: 7 (ref 5.0–8.0)

## 2021-01-25 MED ORDER — CEPHALEXIN 500 MG PO CAPS: 500.0000 mg | ORAL_CAPSULE | Freq: Three times a day (TID) | ORAL | 0 refills | Status: AC

## 2021-01-25 MED ORDER — IBUPROFEN 800 MG PO TABS: 800.0000 mg | ORAL_TABLET | Freq: Three times a day (TID) | ORAL | 0 refills | Status: AC | PRN

## 2021-01-25 MED ORDER — LIDOCAINE 5 % EX PTCH: 1.0000 | MEDICATED_PATCH | Freq: Two times a day (BID) | CUTANEOUS | 0 refills | Status: AC | PRN

## 2021-01-25 NOTE — ED Triage Notes (Signed)
Pt comes with c/o lower back pain. Pt denies any injures.

## 2021-01-25 NOTE — ED Provider Notes (Signed)
[facility] Emergency Department Provider Note ____________________________________________  Time seen: 1626  I have reviewed the triage vital signs and the nursing notes.  HISTORY  Chief Complaint  Back Pain   HPI Desiree Cruz is a 50 y.o. female presents to the ED with evaluation of dysuria, urinary frequency, and left flank pain.  She also endorses some tenderness over the left SI joint.  She denies any recent injury, trauma, or fall.  Denies any associated fevers, chills, sweats patient denies any gross hematuria or urinary retention.  Past Medical History:  Diagnosis Date   Asthma    CHF (congestive heart failure) ([place])    Coronary artery disease    Diabetes mellitus without complication ([place])    Hypertension    Inverted nipple    recent Bil nipple inversion 03/18   Migraines     Patient Active Problem List   Diagnosis Date Noted   Depression 12/27/2017   Diabetic polyneuropathy associated with type 2 diabetes mellitus ([place]) 11/10/2017   Tobacco use disorder 05/16/2013   Nausea 12/24/2012   Sleep disturbance 12/24/2012   Major depressive disorder, recurrent episode ([place]) 12/08/2011   Posttraumatic stress disorder 12/08/2011    Past Surgical History:  Procedure Laterality Date   ABDOMINAL HYSTERECTOMY      Prior to Admission medications   Medication Sig Start Date End Date Taking? Authorizing Provider  cephALEXin (KEFLEX) 500 MG capsule Take 1 capsule (500 mg total) by mouth 3 (three) times daily for 7 days. 01/25/21 02/01/21 Yes Bettyjo Lundblad, Dannielle Karvonen, PA-C  ibuprofen (ADVIL) 800 MG tablet Take 1 tablet (800 mg total) by mouth every 8 (eight) hours as needed. 01/25/21  Yes Fishel Wamble, Dannielle Karvonen, PA-C  lidocaine (LIDODERM) 5 % Place 1 patch onto the skin every 12 (twelve) hours as needed for up to 10 days. Remove & Discard patch after 12 hours of wear each day. 01/25/21 02/04/21 Yes Bertrand Vowels, Dannielle Karvonen, PA-C  albuterol (PROVENTIL  HFA;VENTOLIN HFA) 108 (90 Base) MCG/ACT inhaler Inhale 2 puffs into the lungs every 6 (six) hours as needed for wheezing or shortness of breath. 05/25/17   Fisher, Linden Dolin, PA-C  ARTIFICIAL TEARS 1 % ophthalmic solution Apply to eye. 11/14/19   [provider]  atorvastatin (LIPITOR) 40 MG tablet Take 40 mg by mouth daily.    [provider]  Blood Glucose Monitoring Suppl (ACCU-CHEK GUIDE) w/Device KIT See admin instructions. 11/29/17   [provider]  gabapentin (NEURONTIN) 300 MG capsule Take 300 mg by mouth 2 (two) times daily.    [provider]  loratadine (CLARITIN) 10 MG tablet Take 10 mg by mouth daily.    [provider]  montelukast (SINGULAIR) 10 MG tablet Take by mouth.    [provider]  nitroGLYCERIN (NITROSTAT) 0.4 MG SL tablet DISSOLVE ONE TABLET UNDER THE TONGUE EVERY 5 MINUTES AS NEEDED FOR CHEST PAIN. DO NOT EXCEED A TOTAL OF 3 DOSES IN 15 MINUTES 02/19/18   [provider]  pantoprazole (PROTONIX) 40 MG tablet Take 40 mg by mouth daily. 12/27/19   [provider]  pravastatin (PRAVACHOL) 40 MG tablet Take 40 mg by mouth at bedtime. 10/04/17   [provider]  RESTASIS MULTIDOSE 0.05 % ophthalmic emulsion  12/11/19   [provider]  sertraline (ZOLOFT) 100 MG tablet Take 100 mg by mouth daily. 12/27/19   [provider]  traZODone (DESYREL) 100 MG tablet TAKE 1 TABLET BY MOUTH 1 HOUR BEFORE BEDTIME  11/30/17   [provider]    Allergies Latex and Penicillins  Family History  Problem Relation Age of Onset   Breast cancer Other     Social History Social History   Tobacco Use   Smoking status: Former    Packs/day: 0.50    Types: Cigarettes   Smokeless tobacco: Never  Substance Use Topics   Alcohol use: No    Alcohol/week: 0.0 standard drinks   Drug use: Never    Review of Systems  Constitutional: Negative for fever. Eyes: Negative for visual changes. ENT:  Negative for sore throat. Cardiovascular: Negative for chest pain. Respiratory: Negative for shortness of breath. Gastrointestinal: Negative for abdominal pain, vomiting and diarrhea.  Reports right flank pain Genitourinary: Positive for dysuria urinary frequency Musculoskeletal: Positive for back pain. Skin: Negative for rash. Neurological: Negative for headaches, focal weakness or numbness. ____________________________________________  PHYSICAL EXAM:  VITAL SIGNS: ED Triage Vitals  Enc Vitals Group     BP 01/25/21 1725 130/72     Pulse Rate 01/25/21 1725 72     Resp 01/25/21 1725 18     Temp 01/25/21 1725 98 F (36.7 C)     Temp src --      SpO2 01/25/21 1725 99 %     Weight --      Height --      Head Circumference --      Peak Flow --      Pain Score 01/25/21 1721 10     Pain Loc --      Pain Edu? --      Excl. in [place]? --     Constitutional: Alert and oriented. Well appearing and in no distress. Head: Normocephalic and atraumatic. Eyes: Conjunctivae are normal. Normal extraocular movements Cardiovascular: Normal rate, regular rhythm. Normal distal pulses. Respiratory: Normal respiratory effort. No wheezes/rales/rhonchi. Gastrointestinal: Soft and nontender. No distention. Mild right flank pain on exam Musculoskeletal: Normal spinal alignment without midline tenderness, spasm, deformity, or step-off.  Patient mildly tender to palpation over the right SI joint.  Nontender with normal range of motion in all extremities.  Neurologic:  Normal gait without ataxia. Normal speech and language. No gross focal neurologic deficits are appreciated. Skin:  Skin is warm, dry and intact. No rash noted. Psychiatric: Mood and affect are normal. Patient exhibits appropriate insight and judgment. ____________________________________________    {LABS (pertinent positives/negatives) Labs Reviewed  URINALYSIS, ROUTINE W REFLEX MICROSCOPIC - Abnormal; Notable for the following components:       Result Value   Color, Urine YELLOW (*)    APPearance CLEAR (*)    All other components within normal limits  ____________________________________________  {EKG  ____________________________________________   RADIOLOGY Official radiology report(s): No results found. ____________________________________________  PROCEDURES   Procedures ____________________________________________   INITIAL IMPRESSION / ASSESSMENT AND PLAN / ED COURSE  As part of my medical decision making, I reviewed the following data within the [place] reviewed results pending and Notes from prior ED visits    ED evaluation of dysuria, urgency, and flank pain without associated fevers, nausea vomiting.  Patient will be treated empirically for cystitis, as well as musculoskeletal low back pain, and and will follow with primary provider for ongoing symptoms.  Return precautions been reviewed.   BRACHA FRANKOWSKI was evaluated in Emergency Department on 01/25/2021 for the symptoms described in the history of present illness. She was evaluated in the context of the global COVID-19 pandemic, which necessitated consideration that the  patient might be at risk for infection with the SARS-CoV-2 virus that causes COVID-19. Institutional protocols and algorithms that pertain to the evaluation of patients at risk for COVID-19 are in a state of rapid change based on information released by regulatory bodies including the CDC and federal and state organizations. These policies and algorithms were followed during the patient's care in the ED. ____________________________________________  FINAL CLINICAL IMPRESSION(S) / ED DIAGNOSES  Final diagnoses:  Acute cystitis without hematuria  Strain of lumbar region, initial encounter      Melvenia Needles, PA-C 01/25/21 1751    Lucrezia Starch, MD 01/25/21 Bosie Helper

## 2021-01-25 NOTE — Discharge Instructions (Addendum)
Take the antibiotics, inflammatory,, and use the lidocaine patches as directed.  Follow with primary provider for ongoing urinary symptoms.

## 2021-03-08 ENCOUNTER — Other Ambulatory Visit: Payer: Self-pay | Admitting: Internal Medicine

## 2021-03-08 DIAGNOSIS — Z1231 Encounter for screening mammogram for malignant neoplasm of breast: Secondary | ICD-10-CM

## 2021-07-26 ENCOUNTER — Other Ambulatory Visit: Payer: Self-pay | Admitting: Internal Medicine

## 2021-07-26 DIAGNOSIS — Z1231 Encounter for screening mammogram for malignant neoplasm of breast: Secondary | ICD-10-CM

## 2021-09-26 ENCOUNTER — Emergency Department
Admission: EM | Admit: 2021-09-26 | Discharge: 2021-09-26 | Disposition: A | Discharge status: Home / Self Care | Payer: Medicaid Other | Attending: Emergency Medicine | Admitting: Emergency Medicine

## 2021-09-26 ENCOUNTER — Encounter: Payer: Self-pay | Admitting: Intensive Care

## 2021-09-26 ENCOUNTER — Emergency Department: Payer: Medicaid Other

## 2021-09-26 ENCOUNTER — Other Ambulatory Visit: Payer: Self-pay

## 2021-09-26 DIAGNOSIS — K439 Ventral hernia without obstruction or gangrene: Secondary | ICD-10-CM | POA: Diagnosis not present

## 2021-09-26 DIAGNOSIS — I2510 Atherosclerotic heart disease of native coronary artery without angina pectoris: Secondary | ICD-10-CM | POA: Insufficient documentation

## 2021-09-26 DIAGNOSIS — N3000 Acute cystitis without hematuria: Secondary | ICD-10-CM | POA: Diagnosis not present

## 2021-09-26 DIAGNOSIS — J45909 Unspecified asthma, uncomplicated: Secondary | ICD-10-CM | POA: Diagnosis not present

## 2021-09-26 DIAGNOSIS — I509 Heart failure, unspecified: Secondary | ICD-10-CM | POA: Insufficient documentation

## 2021-09-26 DIAGNOSIS — Z720 Tobacco use: Secondary | ICD-10-CM | POA: Diagnosis not present

## 2021-09-26 DIAGNOSIS — R1031 Right lower quadrant pain: Secondary | ICD-10-CM | POA: Diagnosis present

## 2021-09-26 DIAGNOSIS — E1142 Type 2 diabetes mellitus with diabetic polyneuropathy: Secondary | ICD-10-CM | POA: Insufficient documentation

## 2021-09-26 DIAGNOSIS — I110 Hypertensive heart disease with heart failure: Secondary | ICD-10-CM | POA: Insufficient documentation

## 2021-09-26 LAB — URINALYSIS, ROUTINE W REFLEX MICROSCOPIC
Bilirubin Urine: NEGATIVE
Glucose, UA: NEGATIVE mg/dL
Hgb urine dipstick: NEGATIVE
Ketones, ur: 80 mg/dL — AB
Nitrite: NEGATIVE
Protein, ur: 30 mg/dL — AB
Specific Gravity, Urine: 1.03 (ref 1.005–1.030)
pH: 5 (ref 5.0–8.0)

## 2021-09-26 MED ORDER — NITROFURANTOIN MONOHYD MACRO 100 MG PO CAPS
100.0000 mg | ORAL_CAPSULE | Freq: Once | ORAL | Status: DC
  Filled 2021-09-26: qty 1

## 2021-09-26 MED ORDER — IBUPROFEN 400 MG PO TABS
400.0000 mg | ORAL_TABLET | Freq: Once | ORAL | Status: AC
  Administered 2021-09-26: 400 mg via ORAL
  Filled 2021-09-26: qty 1

## 2021-09-26 MED ORDER — OXYCODONE-ACETAMINOPHEN 5-325 MG PO TABS
1.0000 | ORAL_TABLET | Freq: Once | ORAL | Status: AC
  Administered 2021-09-26: 1 via ORAL
  Filled 2021-09-26: qty 1

## 2021-09-26 MED ORDER — NITROFURANTOIN MONOHYD MACRO 100 MG PO CAPS: 100.0000 mg | ORAL_CAPSULE | Freq: Two times a day (BID) | ORAL | 0 refills | Status: DC

## 2021-09-26 MED ORDER — NITROFURANTOIN MONOHYD MACRO 100 MG PO CAPS: 100.0000 mg | ORAL_CAPSULE | Freq: Two times a day (BID) | ORAL | 0 refills | Status: AC

## 2021-09-26 NOTE — ED Provider Notes (Signed)
[facility] Provider Note    Event Date/Time   First MD Initiated Contact with Patient 09/26/21 1210     (approximate)   History   Groin Pain (right)   HPI  Desiree Cruz is a 51 y.o. female past medical history of CHF, asthma, diabetes, hypertension who presents with right groin pain.  Is been going on for about a week.  There was no preceding trauma.  Patient's pain is around the right hip and radiates to the right inguinal region and to the top of her leg.  It is worse with lying on that side and with movement.  Denies frank abdominal pain no fevers chills nausea vomiting or urinary symptoms.  No history of similar.    Past Medical History:  Diagnosis Date   Asthma    CHF (congestive heart failure) ([facility])    Coronary artery disease    Diabetes mellitus without complication ([facility])    Hypertension    Inverted nipple    recent Bil nipple inversion 03/18   Migraines     Patient Active Problem List   Diagnosis Date Noted   Depression 12/27/2017   Diabetic polyneuropathy associated with type 2 diabetes mellitus ([facility]) 11/10/2017   Tobacco use disorder 05/16/2013   Nausea 12/24/2012   Sleep disturbance 12/24/2012   Major depressive disorder, recurrent episode ([facility]) 12/08/2011   Posttraumatic stress disorder 12/08/2011     Physical Exam  Triage Vital Signs: ED Triage Vitals  Enc Vitals Group     BP 09/26/21 1202 117/70     Pulse Rate 09/26/21 1202 62     Resp 09/26/21 1202 16     Temp 09/26/21 1202 97.8 F (36.6 C)     Temp Source 09/26/21 1202 Oral     SpO2 09/26/21 1202 94 %     Weight 09/26/21 1204 235 lb (106.6 kg)     Height 09/26/21 1204 5\' 9"  (1.753 m)     Head Circumference --      Peak Flow --      Pain Score 09/26/21 1204 9     Pain Loc --      Pain Edu? --      Excl. in [place]? --     Most recent vital signs: Vitals:   09/26/21 1202  BP: 117/70  Pulse: 62  Resp: 16  Temp: 97.8 F (36.6 C)  SpO2: 94%      General: Awake, no distress.  CV:  Good peripheral perfusion.  Resp:  Normal effort.  Abd:  No distention.  Neuro:             Awake, Alert, Oriented x 3  Other:  Patient has tenderness to palpation right above the right inguinal ligament and right inguinal region no obvious hernia or masses there are no skin changes Tenderness to palpation over the right ASIS as well and tenderness with flexion and internal/external rotation of the hip   ED Results / Procedures / Treatments  Labs (all labs ordered are listed, but only abnormal results are displayed) Labs Reviewed  URINALYSIS, ROUTINE W REFLEX MICROSCOPIC - Abnormal; Notable for the following components:      Result Value   Color, Urine AMBER (*)    APPearance CLOUDY (*)    Ketones, ur 80 (*)    Protein, ur 30 (*)    Leukocytes,Ua SMALL (*)    Bacteria, UA RARE (*)    All other components within normal limits  URINE CULTURE  EKG     RADIOLOGY I have reviewed and interpreted the CT which shows a right low ventral hernia   PROCEDURES:  Critical Care performed: No  Procedures    MEDICATIONS ORDERED IN ED: Medications  nitrofurantoin (macrocrystal-monohydrate) (MACROBID) capsule 100 mg (has no administration in time range)  ibuprofen (ADVIL) tablet 400 mg (400 mg Oral Given 09/26/21 1233)  oxyCODONE-acetaminophen (PERCOCET/ROXICET) 5-325 MG per tablet 1 tablet (1 tablet Oral Given 09/26/21 1233)     IMPRESSION / MDM / ASSESSMENT AND PLAN / ED COURSE  I reviewed the triage vital signs and the nursing notes.                              Patient's presentation is most consistent with acute complicated illness / injury requiring diagnostic workup.  Differential diagnosis includes, but is not limited to, hernia not appreciated on exam, osteoarthritis, pinched nerve  Patient is a 51 year old female presenting with atraumatic groin pain.  Her pain is located primarily in the inguinal region.  On exam she is  tender to palpation above the right inguinal ligament almost all the way to the right lower quadrant as well as in the inguinal region and just below it she has pain with range of the hip.  The differential consist of both musculoskeletal pathology including osteoarthritis or nerve entrapment as well as intra-abdominal processes such as psoas abscess or obturator/femoral hernia that I am not appreciating on exam given her habitus.  Will obtain CT abdomen pelvis without contrast to rule out any intra-abdominal process that could be causing her pain we will treat supportively with Percocet and Motrin.  CT shows a right paramedian low pelvic wall hernia.  Repeat abdominal exam patient is tender in the area that the hernia would be based on the CT however because of her habitus it is difficult for me to actually palpate the hernia to say that it is reducible.  Plan to discuss with general surgery.  Dr. Claudine Mouton reviewed the patient's images and notes that given the nature of the hernia sac and defect in the wall that it is almost impossible that it would be reducible but that it is only containing fat and is very unlikely to cause any threat emergently.  He did not feel the patient needed any emergent management.  Recommended follow-up in the office for possible discussion for repeat hernia management.  There was also noted 2 small gas bubbles in the patient's bladder.  She has no urinary symptoms.  No recent instrumentation.  UA does show some white blood cells.  Given this finding we will treat empirically for cystitis.  Discussed return precautions she is appropriate for discharge.     FINAL CLINICAL IMPRESSION(S) / ED DIAGNOSES   Final diagnoses:  Ventral hernia without obstruction or gangrene  Acute cystitis without hematuria     Rx / DC Orders   ED Discharge Orders          Ordered    nitrofurantoin, macrocrystal-monohydrate, (MACROBID) 100 MG capsule  2 times daily        09/26/21 1620              Note:  This document was prepared using Dragon voice recognition software and may include unintentional dictation errors.   Georga Hacking, MD 09/26/21 [phone]

## 2021-09-26 NOTE — ED Triage Notes (Signed)
Patient c/o right groin/thigh pain X1 week. Denies injury

## 2021-09-26 NOTE — Discharge Instructions (Addendum)
You have a hernia on the right side of your abdomen which is likely causing your pain.  Please follow-up with general surgery for discussion of potential surgical management for this.

## 2021-09-26 NOTE — ED Notes (Signed)
Urine sent

## 2021-09-28 LAB — URINE CULTURE: Culture: 70000 — AB

## 2021-10-05 ENCOUNTER — Ambulatory Visit
Admission: RE | Admit: 2021-10-05 | Discharge: 2021-10-05 | Disposition: A | Discharge status: Home / Self Care | Payer: Medicaid Other | Source: Ambulatory Visit | Attending: Internal Medicine | Admitting: Internal Medicine

## 2021-10-05 DIAGNOSIS — Z1231 Encounter for screening mammogram for malignant neoplasm of breast: Secondary | ICD-10-CM | POA: Diagnosis present

## 2022-01-22 ENCOUNTER — Emergency Department: Payer: Medicaid Other

## 2022-01-22 ENCOUNTER — Emergency Department
Admission: EM | Admit: 2022-01-22 | Discharge: 2022-01-22 | Disposition: A | Discharge status: Home / Self Care | Payer: Medicaid Other | Attending: Student in an Organized Health Care Education/Training Program | Admitting: Student in an Organized Health Care Education/Training Program

## 2022-01-22 ENCOUNTER — Other Ambulatory Visit: Payer: Self-pay

## 2022-01-22 DIAGNOSIS — Z20822 Contact with and (suspected) exposure to covid-19: Secondary | ICD-10-CM | POA: Insufficient documentation

## 2022-01-22 DIAGNOSIS — R0789 Other chest pain: Secondary | ICD-10-CM | POA: Insufficient documentation

## 2022-01-22 DIAGNOSIS — I509 Heart failure, unspecified: Secondary | ICD-10-CM | POA: Diagnosis not present

## 2022-01-22 DIAGNOSIS — I110 Hypertensive heart disease with heart failure: Secondary | ICD-10-CM | POA: Insufficient documentation

## 2022-01-22 DIAGNOSIS — M7981 Nontraumatic hematoma of soft tissue: Secondary | ICD-10-CM | POA: Insufficient documentation

## 2022-01-22 DIAGNOSIS — H5789 Other specified disorders of eye and adnexa: Secondary | ICD-10-CM | POA: Diagnosis not present

## 2022-01-22 DIAGNOSIS — R079 Chest pain, unspecified: Secondary | ICD-10-CM

## 2022-01-22 DIAGNOSIS — E119 Type 2 diabetes mellitus without complications: Secondary | ICD-10-CM | POA: Diagnosis not present

## 2022-01-22 DIAGNOSIS — I2510 Atherosclerotic heart disease of native coronary artery without angina pectoris: Secondary | ICD-10-CM | POA: Insufficient documentation

## 2022-01-22 DIAGNOSIS — R0602 Shortness of breath: Secondary | ICD-10-CM | POA: Insufficient documentation

## 2022-01-22 DIAGNOSIS — Z760 Encounter for issue of repeat prescription: Secondary | ICD-10-CM | POA: Insufficient documentation

## 2022-01-22 DIAGNOSIS — J45909 Unspecified asthma, uncomplicated: Secondary | ICD-10-CM | POA: Insufficient documentation

## 2022-01-22 LAB — RESP PANEL BY RT-PCR (FLU A&B, COVID) ARPGX2
Influenza A by PCR: NEGATIVE
Influenza B by PCR: NEGATIVE
SARS Coronavirus 2 by RT PCR: NEGATIVE

## 2022-01-22 LAB — BASIC METABOLIC PANEL
Anion gap: 4 — ABNORMAL LOW (ref 5–15)
BUN: 16 mg/dL (ref 6–20)
CO2: 27 mmol/L (ref 22–32)
Calcium: 8.5 mg/dL — ABNORMAL LOW (ref 8.9–10.3)
Chloride: 109 mmol/L (ref 98–111)
Creatinine, Ser: 0.76 mg/dL (ref 0.44–1.00)
GFR, Estimated: >60 mL/min (ref >60)
Glucose, Bld: 113 mg/dL — ABNORMAL HIGH (ref 70–99)
Potassium: 4.4 mmol/L (ref 3.5–5.1)
Sodium: 140 mmol/L (ref 135–145)

## 2022-01-22 LAB — D-DIMER, QUANTITATIVE: D-Dimer, Quant: <0.27 ug/mL-FEU (ref 0.00–0.50)

## 2022-01-22 LAB — CBC
HCT: 43 % (ref 36.0–46.0)
Hemoglobin: 14 g/dL (ref 12.0–15.0)
MCH: 31.2 pg (ref 26.0–34.0)
MCHC: 32.6 g/dL (ref 30.0–36.0)
MCV: 95.8 fL (ref 80.0–100.0)
Platelets: 210 10*3/uL (ref 150–400)
RBC: 4.49 MIL/uL (ref 3.87–5.11)
RDW: 12.5 % (ref 11.5–15.5)
WBC: 10.3 10*3/uL (ref 4.0–10.5)
nRBC: 0 % (ref 0.0–0.2)

## 2022-01-22 LAB — TROPONIN I (HIGH SENSITIVITY): Troponin I (High Sensitivity): 3 ng/L (ref <18)

## 2022-01-22 LAB — BRAIN NATRIURETIC PEPTIDE: B Natriuretic Peptide: 18 pg/mL (ref 0.0–100.0)

## 2022-01-22 MED ORDER — OLOPATADINE HCL 0.1 % OP SOLN: 1.0000 [drp] | Freq: Two times a day (BID) | OPHTHALMIC | 1 refills | Status: AC

## 2022-01-22 MED ORDER — ALBUTEROL SULFATE (2.5 MG/3ML) 0.083% IN NEBU: 2.5000 mg | INHALATION_SOLUTION | RESPIRATORY_TRACT | 2 refills | Status: DC | PRN

## 2022-01-22 MED ORDER — ALBUTEROL SULFATE HFA 108 (90 BASE) MCG/ACT IN AERS: 2.0000 | INHALATION_SPRAY | RESPIRATORY_TRACT | 1 refills | Status: DC | PRN

## 2022-01-22 MED ORDER — CETIRIZINE HCL 10 MG PO TABS: 10.0000 mg | ORAL_TABLET | Freq: Every day | ORAL | 2 refills | Status: AC

## 2022-01-22 NOTE — ED Provider Notes (Signed)
[facility] Provider Note    Event Date/Time   First MD Initiated Contact with Patient 01/22/22 1720     (approximate)   History   Eye Pain   HPI  Desiree Cruz is a 51 y.o. female with history of hypertension, CAD, DM, CHF, and asthma presents to the ER for evaluation of chest tightness shortness of breath that started 2 days ago. She is also having right eye itching, but is out of her eye drops for allergies. Face is tender around the eye because she has been rubbing and scratching. No drainage/crusting from the eye upon awakening. She is also out of her albuterol inhaler, but used a friend's and felt some better yesterday. She denies sore throat, fever, nausea, vomiting, body aches or headache.      Physical Exam   Triage Vital Signs: ED Triage Vitals  Enc Vitals Group     BP 01/22/22 1642 126/77     Pulse Rate 01/22/22 1642 (!) 103     Resp 01/22/22 1642 18     Temp 01/22/22 1642 98.1 F (36.7 C)     Temp src --      SpO2 01/22/22 1642 96 %     Weight --      Height --      Head Circumference --      Peak Flow --      Pain Score 01/22/22 1641 3     Pain Loc --      Pain Edu? --      Excl. in GC? --     Most recent vital signs: Vitals:   01/22/22 1642  BP: 126/77  Pulse: (!) 103  Resp: 18  Temp: 98.1 F (36.7 C)  SpO2: 96%     General: Awake, no distress.  CV:  Good peripheral perfusion.  Resp:  Normal effort. Breath sounds clear to auscultation. Abd:  No distention.  Other:  OD with mild scleral erythema. Facial ecchymosis with focal tenderness just under the OD. No drainage noted on the lower lid. No crusting.    ED Results / Procedures / Treatments   Labs (all labs ordered are listed, but only abnormal results are displayed) Labs Reviewed  BASIC METABOLIC PANEL - Abnormal; Notable for the following components:      Result Value   Glucose, Bld 113 (*)    Calcium 8.5 (*)    Anion gap 4 (*)    All other components  within normal limits  RESP PANEL BY RT-PCR (FLU A&B, COVID) ARPGX2  CBC  BRAIN NATRIURETIC PEPTIDE  D-DIMER, QUANTITATIVE  TROPONIN I (HIGH SENSITIVITY)  TROPONIN I (HIGH SENSITIVITY)     EKG  Normal sinus rhythm, rate of 68.  RADIOLOGY Chest x-ray viewed and interpreted by me: No acute cardiopulmonary abnormality noted.    PROCEDURES:  Critical Care performed: No  Procedures   MEDICATIONS ORDERED IN ED: Medications - No data to display   IMPRESSION / MDM / ASSESSMENT AND PLAN / ED COURSE  I reviewed the triage vital signs and the nursing notes.                              Differential diagnosis includes, but is not limited to: COVID, influenza, CHF, PE, MI  51 year old female presenting to the emergency department for treatment and evaluation of multiple medical complaints as listed in the HPI.  Plan will be to perform cardiac  work-up with her history of CHF, DM, CAD with new onset of chest pain and shortness of breath.  Patient's presentation is most consistent with acute presentation with potential threat to life or bodily function.  Labs are reassuring including troponin and D-dimer.  Heart rate 68 on bedside cardiac monitor.  I question erroneous reading in triage.  Patient's chest tightness started 2 days ago therefore serial troponins are not indicated at this time.  Patient is standing at the door requesting discharge.  We reviewed all of her test results.  She is comfortable with the plan of following up with her primary care if not improving over the next couple of days.  Medication sent to pharmacy of her choice.  ER return precautions discussed.         FINAL CLINICAL IMPRESSION(S) / ED DIAGNOSES   Final diagnoses:  Shortness of breath  Nonspecific chest pain  Medication refill     Rx / DC Orders   ED Discharge Orders          Ordered    albuterol (PROVENTIL) (2.5 MG/3ML) 0.083% nebulizer solution  Every 4 hours PRN        01/22/22 1947     albuterol (VENTOLIN HFA) 108 (90 Base) MCG/ACT inhaler  Every 4 hours PRN        01/22/22 1947    cetirizine (ZYRTEC ALLERGY) 10 MG tablet  Daily        01/22/22 1947    olopatadine (PATANOL) 0.1 % ophthalmic solution  2 times daily        01/22/22 1950             Note:  This document was prepared using Dragon voice recognition software and may include unintentional dictation errors.   Victorino Dike, FNP 01/22/22 1953    Merlyn Lot, MD 01/25/22 [phone]

## 2022-01-22 NOTE — ED Triage Notes (Addendum)
Pt comes with c/o right eye pain and swelling. Pt states some little breathing trouble when she lays down. Pt denies any cough or fever. Pt states neg covid test at home. Pt states eye is hot feeling and feels like it is twitching.   Pt states hx of asthma and no relief with at home meds. Pt is in NAD at this time. Pt states some throat pain and it hurts when she tries to eat.

## 2022-07-06 ENCOUNTER — Ambulatory Visit: Payer: Medicaid Other | Admitting: Family

## 2022-08-11 ENCOUNTER — Encounter: Payer: Self-pay | Admitting: Internal Medicine

## 2022-08-12 ENCOUNTER — Encounter: Payer: Self-pay | Admitting: Internal Medicine

## 2022-12-29 ENCOUNTER — Ambulatory Visit: Payer: Medicaid Other | Admitting: Family

## 2023-01-02 ENCOUNTER — Encounter: Payer: Self-pay | Admitting: Family

## 2023-01-02 ENCOUNTER — Ambulatory Visit (INDEPENDENT_AMBULATORY_CARE_PROVIDER_SITE_OTHER): Payer: Medicaid Other | Admitting: Family

## 2023-01-02 VITALS — BP 140/90 | HR 89 | Ht 69.0 in | Wt 225.2 lb

## 2023-01-02 DIAGNOSIS — E559 Vitamin D deficiency, unspecified: Secondary | ICD-10-CM | POA: Insufficient documentation

## 2023-01-02 DIAGNOSIS — I10 Essential (primary) hypertension: Secondary | ICD-10-CM | POA: Insufficient documentation

## 2023-01-02 DIAGNOSIS — F331 Major depressive disorder, recurrent, moderate: Secondary | ICD-10-CM

## 2023-01-02 DIAGNOSIS — E039 Hypothyroidism, unspecified: Secondary | ICD-10-CM

## 2023-01-02 DIAGNOSIS — E538 Deficiency of other specified B group vitamins: Secondary | ICD-10-CM | POA: Insufficient documentation

## 2023-01-02 DIAGNOSIS — E1142 Type 2 diabetes mellitus with diabetic polyneuropathy: Secondary | ICD-10-CM | POA: Diagnosis not present

## 2023-01-02 DIAGNOSIS — E782 Mixed hyperlipidemia: Secondary | ICD-10-CM | POA: Insufficient documentation

## 2023-01-02 MED ORDER — BUSPIRONE HCL 5 MG PO TABS: 5.0000 mg | ORAL_TABLET | Freq: Three times a day (TID) | ORAL | 2 refills | Status: DC

## 2023-01-02 NOTE — Assessment & Plan Note (Signed)
Blood pressure well controlled with current medications.  Continue current therapy.  Will reassess at follow up.  

## 2023-01-02 NOTE — Assessment & Plan Note (Signed)
Checking labs today.  Will continue supplements as needed.  

## 2023-01-02 NOTE — Assessment & Plan Note (Signed)
Checking labs today. Will call pt. With results  Continue current diabetes POC, as patient has been well controlled on current regimen.  Will adjust meds if needed based on labs.  

## 2023-01-02 NOTE — Progress Notes (Signed)
Established Patient Office Visit  Subjective:  Patient ID: Desiree Cruz, female    DOB: [birth year]  Age: 52 y.o. MRN: [id number]  Chief Complaint  Patient presents with   Follow-up    Med refill,headaches,dizziness    Patient here today for follow up.  She needs refills of her meds.   She says that she is mostly concerned because her anxiety is high due to issues with her brother.  He is lashing out at his family members, generally not taking care of himself. She says that he has her "nerves all torn up".    She is also having some trouble with her left foot, says that she has a spot on the bottom of her foot, feels like "walking on a needle".   Needs labs.   No other concerns at this time.   Past Medical History:  Diagnosis Date   Asthma    CHF (congestive heart failure) (HCC)    Coronary artery disease    Diabetes mellitus without complication (HCC)    Hypertension    Inverted nipple    recent Bil nipple inversion 03/18   Migraines     Past Surgical History:  Procedure Laterality Date   ABDOMINAL HYSTERECTOMY      Social History   Socioeconomic History   Marital status: Legally Separated    Spouse name: Not on file   Number of children: Not on file   Years of education: Not on file   Highest education level: Not on file  Occupational History   Not on file  Tobacco Use   Smoking status: Every Day    Current packs/day: 0.50    Types: Cigarettes   Smokeless tobacco: Never  Substance and Sexual Activity   Alcohol use: No    Alcohol/week: 0.0 standard drinks of alcohol   Drug use: Never   Sexual activity: Not on file  Other Topics Concern   Not on file  Social History Narrative   Not on file   Social Determinants of Health   Financial Resource Strain: Not on file  Food Insecurity: Not on file  Transportation Needs: Not on file  Physical Activity: Not on file  Stress: Not on file  Social Connections: Not on file  Intimate Partner Violence:  Not on file    Family History  Problem Relation Age of Onset   Breast cancer Other     Allergies  Allergen Reactions   Latex Rash   Penicillins Rash    Review of Systems  Musculoskeletal:        Left foot pain  All other systems reviewed and are negative.      Objective:   BP (!) 140/90   Pulse 89   Ht 5\' 9"  (1.753 m)   Wt 225 lb 3.2 oz (102.2 kg)   SpO2 98%   BMI 33.26 kg/m   Vitals:   01/02/23 1341  BP: (!) 140/90  Pulse: 89  Height: 5\' 9"  (1.753 m)  Weight: 225 lb 3.2 oz (102.2 kg)  SpO2: 98%  BMI (Calculated): 33.24    Physical Exam Vitals and nursing note reviewed.  Constitutional:      Appearance: Normal appearance. She is normal weight.  HENT:     Head: Normocephalic.  Eyes:     Extraocular Movements: Extraocular movements intact.     Conjunctiva/sclera: Conjunctivae normal.     Pupils: Pupils are equal, round, and reactive to light.  Cardiovascular:     Rate and Rhythm: Normal  rate.  Pulmonary:     Effort: Pulmonary effort is normal.  Musculoskeletal:        General: Normal range of motion.  Neurological:     General: No focal deficit present.     Mental Status: She is alert and oriented to person, place, and time. Mental status is at baseline.  Psychiatric:        Mood and Affect: Mood normal.        Behavior: Behavior normal.        Thought Content: Thought content normal.        Judgment: Judgment normal.      No results found for any visits on 01/02/23.  No results found for this or any previous visit (from the past 2160 hour(s)).     Assessment & Plan:   Problem List Items Addressed This Visit       Active Problems   Depression   Relevant Medications   busPIRone (BUSPAR) 5 MG tablet   Other Relevant Orders   CMP14+EGFR   CBC with Differential/Platelet   Diabetic polyneuropathy associated with type 2 diabetes mellitus (HCC)    Checking labs today. Will call pt. With results  Continue current diabetes POC, as  patient has been well controlled on current regimen.  Will adjust meds if needed based on labs.       Relevant Medications   busPIRone (BUSPAR) 5 MG tablet   Other Relevant Orders   CMP14+EGFR   Hemoglobin A1c   CBC with Differential/Platelet   Essential hypertension, benign    Blood pressure well controlled with current medications.  Continue current therapy.  Will reassess at follow up.       Relevant Orders   CMP14+EGFR   CBC with Differential/Platelet   Mixed hyperlipidemia    Checking labs today.  Continue current therapy for lipid control. Will modify as needed based on labwork results.       Relevant Orders   Lipid panel   CMP14+EGFR   CBC with Differential/Platelet   B12 deficiency due to diet - Primary    Checking labs today.  Will continue supplements as needed.       Relevant Orders   CMP14+EGFR   Vitamin B12   CBC with Differential/Platelet   Vitamin D deficiency, unspecified    Checking labs today.  Will continue supplements as needed.       Relevant Orders   VITAMIN D 25 Hydroxy (Vit-D Deficiency, Fractures)   CMP14+EGFR   CBC with Differential/Platelet   Other Visit Diagnoses     Hypothyroidism (acquired)       Relevant Orders   CMP14+EGFR   TSH   CBC with Differential/Platelet       Return in about 6 weeks (around 02/13/2023) for F/U.   Total time spent: 30 minutes  Miki Kins, FNP  01/02/2023   This document may have been prepared by [facility] Voice Recognition software and as such may include unintentional dictation errors.

## 2023-01-02 NOTE — Assessment & Plan Note (Signed)
Checking labs today.  Continue current therapy for lipid control. Will modify as needed based on labwork results.  

## 2023-02-13 ENCOUNTER — Ambulatory Visit: Payer: Medicaid Other | Admitting: Family

## 2023-02-13 ENCOUNTER — Encounter: Payer: Self-pay | Admitting: Family

## 2023-02-13 VITALS — BP 120/80 | HR 80 | Ht 69.0 in | Wt 222.6 lb

## 2023-02-13 DIAGNOSIS — Z23 Encounter for immunization: Secondary | ICD-10-CM

## 2023-02-13 DIAGNOSIS — E782 Mixed hyperlipidemia: Secondary | ICD-10-CM

## 2023-02-13 DIAGNOSIS — E559 Vitamin D deficiency, unspecified: Secondary | ICD-10-CM

## 2023-02-13 DIAGNOSIS — I10 Essential (primary) hypertension: Secondary | ICD-10-CM

## 2023-02-13 DIAGNOSIS — F331 Major depressive disorder, recurrent, moderate: Secondary | ICD-10-CM

## 2023-02-13 DIAGNOSIS — E1142 Type 2 diabetes mellitus with diabetic polyneuropathy: Secondary | ICD-10-CM

## 2023-02-13 DIAGNOSIS — E1165 Type 2 diabetes mellitus with hyperglycemia: Secondary | ICD-10-CM | POA: Diagnosis not present

## 2023-02-13 DIAGNOSIS — R300 Dysuria: Secondary | ICD-10-CM

## 2023-02-13 DIAGNOSIS — E039 Hypothyroidism, unspecified: Secondary | ICD-10-CM

## 2023-02-13 DIAGNOSIS — E538 Deficiency of other specified B group vitamins: Secondary | ICD-10-CM

## 2023-02-13 LAB — POCT URINALYSIS DIPSTICK
Blood, UA: NEGATIVE
Glucose, UA: NEGATIVE
Leukocytes, UA: NEGATIVE
Nitrite, UA: NEGATIVE
Protein, UA: NEGATIVE
Spec Grav, UA: >=1.03 — AB (ref 1.010–1.025)
Urobilinogen, UA: 0.2 U/dL
pH, UA: 5.5 (ref 5.0–8.0)

## 2023-02-13 LAB — POCT CBG (FASTING - GLUCOSE)-MANUAL ENTRY: Glucose Fasting, POC: 91 mg/dL (ref 70–99)

## 2023-02-13 LAB — POC CREATINE & ALBUMIN,URINE
Albumin/Creatinine Ratio, Urine, POC: <30
Creatinine, POC: 300 mg/dL
Microalbumin Ur, POC: 30 mg/L

## 2023-02-13 MED ORDER — PREDNISONE 20 MG PO TABS: 40.0000 mg | ORAL_TABLET | Freq: Every day | ORAL | 0 refills | Status: DC

## 2023-02-13 MED ORDER — VENTOLIN HFA 108 (90 BASE) MCG/ACT IN AERS: 1.0000 | INHALATION_SPRAY | RESPIRATORY_TRACT | 6 refills | Status: DC | PRN

## 2023-02-13 MED ORDER — BUSPIRONE HCL 5 MG PO TABS: 5.0000 mg | ORAL_TABLET | Freq: Three times a day (TID) | ORAL | 4 refills | Status: DC

## 2023-02-13 MED ORDER — ALBUTEROL SULFATE (2.5 MG/3ML) 0.083% IN NEBU: 2.5000 mg | INHALATION_SOLUTION | RESPIRATORY_TRACT | 4 refills | Status: DC | PRN

## 2023-02-13 MED ORDER — ONDANSETRON 4 MG PO TBDP: 4.0000 mg | ORAL_TABLET | Freq: Three times a day (TID) | ORAL | 0 refills | Status: DC | PRN

## 2023-02-13 MED ORDER — LEVOFLOXACIN 500 MG PO TABS: 500.0000 mg | ORAL_TABLET | Freq: Every day | ORAL | 0 refills | Status: AC

## 2023-02-13 NOTE — Assessment & Plan Note (Signed)
Checking labs today.  Will continue supplements as needed.  

## 2023-02-13 NOTE — Assessment & Plan Note (Signed)
Patient stable.  Well controlled with current therapy.   Continue current meds.  

## 2023-02-13 NOTE — Assessment & Plan Note (Signed)
Checking labs today.  Continue current therapy for lipid control. Will modify as needed based on labwork results.  

## 2023-02-13 NOTE — Progress Notes (Signed)
Established Patient Office Visit  Subjective:  Patient ID: Desiree Cruz, female    DOB: [birth year]  Age: 52 y.o. MRN: [id number]  Chief Complaint  Patient presents with   Follow-up    6 week f/u    Patient is here today for her 3 months follow up.  She has been feeling fairly well since last appointment.   She does have additional concerns to discuss today.  She has been having upper respiratory issues, as they are in the process of paving her street and she needs something to help with an exacerbation.  Needs a flu shot.  Labs are due today - she did not ever manage to get back to have them drawn at her previous appointment.  She needs refills.   I have reviewed her active problem list, medication list, allergies, notes from last encounter, lab results for her appointment today.      No other concerns at this time.   Past Medical History:  Diagnosis Date   Asthma    CHF (congestive heart failure) (HCC)    Coronary artery disease    Diabetes mellitus without complication (HCC)    Hypertension    Inverted nipple    recent Bil nipple inversion 03/18   Migraines     Past Surgical History:  Procedure Laterality Date   ABDOMINAL HYSTERECTOMY      Social History   Socioeconomic History   Marital status: Legally Separated    Spouse name: Not on file   Number of children: Not on file   Years of education: Not on file   Highest education level: Not on file  Occupational History   Not on file  Tobacco Use   Smoking status: Some Days    Current packs/day: 0.10    Types: Cigarettes   Smokeless tobacco: Never  Substance and Sexual Activity   Alcohol use: No    Alcohol/week: 0.0 standard drinks of alcohol   Drug use: Never   Sexual activity: Not on file  Other Topics Concern   Not on file  Social History Narrative   Not on file   Social Determinants of Health   Financial Resource Strain: Not on file  Food Insecurity: Not on file  Transportation  Needs: Not on file  Physical Activity: Not on file  Stress: Not on file  Social Connections: Not on file  Intimate Partner Violence: Not on file    Family History  Problem Relation Age of Onset   Breast cancer Other     Allergies  Allergen Reactions   Latex Rash   Penicillins Rash    Review of Systems  HENT:  Positive for congestion, ear pain and sinus pain.   Respiratory:  Positive for cough, sputum production, shortness of breath and wheezing.   All other systems reviewed and are negative.      Objective:   BP 120/80   Pulse 80   Ht 5\' 9"  (1.753 m)   Wt 222 lb 9.6 oz (101 kg)   SpO2 98%   BMI 32.87 kg/m   Vitals:   02/13/23 1052  BP: 120/80  Pulse: 80  Height: 5\' 9"  (1.753 m)  Weight: 222 lb 9.6 oz (101 kg)  SpO2: 98%  BMI (Calculated): 32.86    Physical Exam Vitals and nursing note reviewed.  Constitutional:      Appearance: Normal appearance. She is normal weight.  HENT:     Head: Normocephalic.  Eyes:     Pupils: Pupils  are equal, round, and reactive to light.  Cardiovascular:     Rate and Rhythm: Normal rate.  Pulmonary:     Effort: Pulmonary effort is normal.  Neurological:     General: No focal deficit present.     Mental Status: She is alert and oriented to person, place, and time. Mental status is at baseline.  Psychiatric:        Mood and Affect: Mood normal.        Behavior: Behavior normal.        Thought Content: Thought content normal.        Judgment: Judgment normal.      Results for orders placed or performed in visit on 02/13/23  POCT CBG (Fasting - Glucose)  Result Value Ref Range   Glucose Fasting, POC 91 70 - 99 mg/dL  POCT Urinalysis Dipstick (81002)  Result Value Ref Range   Color, UA Yellow    Clarity, UA Clear    Glucose, UA Negative Negative   Bilirubin, UA Small    Ketones, UA Trace    Spec Grav, UA >=1.030 (A) 1.010 - 1.025   Blood, UA Negative    pH, UA 5.5 5.0 - 8.0   Protein, UA Negative Negative    Urobilinogen, UA 0.2 0.2 or 1.0 E.U./dL   Nitrite, UA Negative    Leukocytes, UA Negative Negative   Appearance     Odor    POC CREATINE & ALBUMIN,URINE  Result Value Ref Range   Microalbumin Ur, POC 30 mg/L   Creatinine, POC 300 mg/dL   Albumin/Creatinine Ratio, Urine, POC <30     Recent Results (from the past 2160 hour(s))  POCT CBG (Fasting - Glucose)     Status: None   Collection Time: 02/13/23 10:57 AM  Result Value Ref Range   Glucose Fasting, POC 91 70 - 99 mg/dL  POCT Urinalysis Dipstick (29528)     Status: Abnormal   Collection Time: 02/13/23 11:17 AM  Result Value Ref Range   Color, UA Yellow    Clarity, UA Clear    Glucose, UA Negative Negative   Bilirubin, UA Small    Ketones, UA Trace    Spec Grav, UA >=1.030 (A) 1.010 - 1.025   Blood, UA Negative    pH, UA 5.5 5.0 - 8.0   Protein, UA Negative Negative   Urobilinogen, UA 0.2 0.2 or 1.0 E.U./dL   Nitrite, UA Negative    Leukocytes, UA Negative Negative   Appearance     Odor    POC CREATINE & ALBUMIN,URINE     Status: Normal   Collection Time: 02/13/23 11:58 AM  Result Value Ref Range   Microalbumin Ur, POC 30 mg/L   Creatinine, POC 300 mg/dL   Albumin/Creatinine Ratio, Urine, POC <30        Assessment & Plan:   Problem List Items Addressed This Visit       Active Problems   Depression   Relevant Medications   busPIRone (BUSPAR) 5 MG tablet   Diabetic polyneuropathy associated with type 2 diabetes mellitus (HCC)    Patient stable.  Well controlled with current therapy.   Continue current meds.        Relevant Medications   busPIRone (BUSPAR) 5 MG tablet   Major depressive disorder, recurrent episode (HCC)    Patient stable.  Well controlled with current therapy.   Continue current meds.        Relevant Medications   busPIRone (BUSPAR) 5 MG tablet  Essential hypertension, benign    Blood pressure well controlled with current medications.  Continue current therapy.  Will reassess at  follow up.        Relevant Orders   CMP14+EGFR   CBC with Diff   Mixed hyperlipidemia    Checking labs today.  Continue current therapy for lipid control. Will modify as needed based on labwork results.        Relevant Orders   Lipid panel   CMP14+EGFR   CBC with Diff   B12 deficiency due to diet    Checking labs today.  Will continue supplements as needed.        Relevant Orders   CMP14+EGFR   Vitamin B12   CBC with Diff   Vitamin D deficiency, unspecified    Checking labs today.  Will continue supplements as needed.        Relevant Orders   VITAMIN D 25 Hydroxy (Vit-D Deficiency, Fractures)   CMP14+EGFR   CBC with Diff   Type 2 diabetes mellitus with hyperglycemia (HCC) - Primary    Checking labs today. Will call pt. With results  Continue current diabetes POC, as patient has been well controlled on current regimen.  Will adjust meds if needed based on labs      Relevant Orders   POCT CBG (Fasting - Glucose) (Completed)   CMP14+EGFR   Hemoglobin A1c   CBC with Diff   POC CREATINE & ALBUMIN,URINE (Completed)   Other Visit Diagnoses     Burning with urination       UA in office abnormal.  Sending UA/UC.  will call wtih results.   Relevant Orders   POCT Urinalysis Dipstick (81002) (Completed)   Urinalysis, Routine w reflex microscopic   Urine Culture   CMP14+EGFR   CBC with Diff   Hypothyroidism (acquired)       Relevant Orders   CMP14+EGFR   TSH   CBC with Diff   Needs flu shot       Flu vaccine given in office today.  Patient encouraged to manage symptoms as needed with supportive measures.   Relevant Orders   Influenza, MDCK, trivalent, PF(Flucelvax egg-free) (Completed)       Return in about 3 months (around 05/16/2023).   Total time spent: 30 minutes  Miki Kins, FNP  02/13/2023   This document may have been prepared by [facility] Voice Recognition software and as such may include unintentional dictation errors.

## 2023-02-13 NOTE — Assessment & Plan Note (Signed)
Blood pressure well controlled with current medications.  Continue current therapy.  Will reassess at follow up.  

## 2023-02-13 NOTE — Assessment & Plan Note (Addendum)
Checking labs today. Will call pt. With results  Continue current diabetes POC, as patient has been well controlled on current regimen.  Will adjust meds if needed based on labs.  

## 2023-02-14 LAB — CBC WITH DIFFERENTIAL/PLATELET
Basophils Absolute: 0 10*3/uL (ref 0.0–0.2)
Basos: 1 %
EOS (ABSOLUTE): 0.2 10*3/uL (ref 0.0–0.4)
Eos: 4 %
Hematocrit: 43.2 % (ref 34.0–46.6)
Hemoglobin: 13.9 g/dL (ref 11.1–15.9)
Immature Grans (Abs): 0 10*3/uL (ref 0.0–0.1)
Immature Granulocytes: 0 %
Lymphocytes Absolute: 1.7 10*3/uL (ref 0.7–3.1)
Lymphs: 26 %
MCH: 31.2 pg (ref 26.6–33.0)
MCHC: 32.2 g/dL (ref 31.5–35.7)
MCV: 97 fL (ref 79–97)
Monocytes Absolute: 0.3 10*3/uL (ref 0.1–0.9)
Monocytes: 5 %
Neutrophils Absolute: 4.1 10*3/uL (ref 1.4–7.0)
Neutrophils: 64 %
Platelets: 228 10*3/uL (ref 150–450)
RBC: 4.45 x10E6/uL (ref 3.77–5.28)
RDW: 12.2 % (ref 11.7–15.4)
WBC: 6.3 10*3/uL (ref 3.4–10.8)

## 2023-02-14 LAB — URINALYSIS, ROUTINE W REFLEX MICROSCOPIC
Bilirubin, UA: NEGATIVE
Glucose, UA: NEGATIVE
Nitrite, UA: NEGATIVE
RBC, UA: NEGATIVE
Specific Gravity, UA: 1.027 (ref 1.005–1.030)
Urobilinogen, Ur: 0.2 mg/dL (ref 0.2–1.0)
pH, UA: 5.5 (ref 5.0–7.5)

## 2023-02-14 LAB — HEMOGLOBIN A1C
Est. average glucose Bld gHb Est-mCnc: 117 mg/dL
Hgb A1c MFr Bld: 5.7 % — ABNORMAL HIGH (ref 4.8–5.6)

## 2023-02-14 LAB — CMP14+EGFR
ALT: 11 [IU]/L (ref 0–32)
AST: 13 [IU]/L (ref 0–40)
Albumin: 3.8 g/dL (ref 3.8–4.9)
Alkaline Phosphatase: 44 [IU]/L (ref 44–121)
BUN/Creatinine Ratio: 19 (ref 9–23)
BUN: 11 mg/dL (ref 6–24)
Bilirubin Total: 0.3 mg/dL (ref 0.0–1.2)
CO2: 20 mmol/L (ref 20–29)
Calcium: 9.2 mg/dL (ref 8.7–10.2)
Chloride: 108 mmol/L — ABNORMAL HIGH (ref 96–106)
Creatinine, Ser: 0.59 mg/dL (ref 0.57–1.00)
Globulin, Total: 2.1 g/dL (ref 1.5–4.5)
Glucose: 80 mg/dL (ref 70–99)
Potassium: 4.3 mmol/L (ref 3.5–5.2)
Sodium: 142 mmol/L (ref 134–144)
Total Protein: 5.9 g/dL — ABNORMAL LOW (ref 6.0–8.5)
eGFR: 108 mL/min/{1.73_m2} (ref >59)

## 2023-02-14 LAB — LIPID PANEL
Chol/HDL Ratio: 4.1 ratio (ref 0.0–4.4)
Cholesterol, Total: 199 mg/dL (ref 100–199)
HDL: 49 mg/dL (ref >39)
LDL Chol Calc (NIH): 126 mg/dL — ABNORMAL HIGH (ref 0–99)
Triglycerides: 137 mg/dL (ref 0–149)
VLDL Cholesterol Cal: 24 mg/dL (ref 5–40)

## 2023-02-14 LAB — VITAMIN B12: Vitamin B-12: 317 pg/mL (ref 232–1245)

## 2023-02-14 LAB — MICROSCOPIC EXAMINATION
Casts: NONE SEEN /[LPF]
Epithelial Cells (non renal): >10 /[HPF] — AB (ref 0–10)

## 2023-02-14 LAB — VITAMIN D 25 HYDROXY (VIT D DEFICIENCY, FRACTURES): Vit D, 25-Hydroxy: 17.8 ng/mL — ABNORMAL LOW (ref 30.0–100.0)

## 2023-02-14 LAB — TSH: TSH: 1.92 u[IU]/mL (ref 0.450–4.500)

## 2023-02-17 ENCOUNTER — Other Ambulatory Visit: Payer: Self-pay

## 2023-02-17 LAB — URINE CULTURE

## 2023-02-17 MED ORDER — VITAMIN D (ERGOCALCIFEROL) 1.25 MG (50000 UNIT) PO CAPS: 50000.0000 [IU] | ORAL_CAPSULE | ORAL | 3 refills | Status: DC

## 2023-03-01 ENCOUNTER — Telehealth: Payer: Self-pay | Admitting: Family

## 2023-03-01 MED ORDER — BREZTRI AEROSPHERE 160-9-4.8 MCG/ACT IN AERO: 2.0000 | INHALATION_SPRAY | Freq: Two times a day (BID) | RESPIRATORY_TRACT | 3 refills | Status: DC

## 2023-03-01 NOTE — Telephone Encounter (Signed)
Patient called in about the inhalers we gave her as samples - Breztri. Could we send in an Rx for Breztri?    [facility] - [address]

## 2023-03-01 NOTE — Addendum Note (Signed)
Addended by: Grayling Congress on: 03/01/2023 03:56 PM   Modules accepted: Orders

## 2023-04-14 ENCOUNTER — Other Ambulatory Visit: Payer: Self-pay | Admitting: Family

## 2023-04-26 ENCOUNTER — Telehealth: Payer: Medicaid Other | Admitting: Family Medicine

## 2023-04-26 DIAGNOSIS — J45901 Unspecified asthma with (acute) exacerbation: Secondary | ICD-10-CM

## 2023-04-26 MED ORDER — DOXYCYCLINE HYCLATE 100 MG PO TABS: 100.0000 mg | ORAL_TABLET | Freq: Two times a day (BID) | ORAL | 0 refills | Status: AC

## 2023-04-26 MED ORDER — PREDNISONE 10 MG (21) PO TBPK: ORAL_TABLET | ORAL | 0 refills | Status: DC

## 2023-04-26 MED ORDER — VENTOLIN HFA 108 (90 BASE) MCG/ACT IN AERS: 1.0000 | INHALATION_SPRAY | RESPIRATORY_TRACT | 0 refills | Status: DC | PRN

## 2023-04-26 NOTE — Patient Instructions (Signed)
 Desiree Cruz, thank you for joining Roosvelt Mater, PA-C for today's virtual visit.  While this provider is not your primary care provider (PCP), if your PCP is located in our provider database this encounter information will be shared with them immediately following your visit.   A [facility] MyChart account gives you access to today's visit and all your visits, tests, and labs performed at [facility], [place]  click here if you don't have a [facility] MyChart account or go to [email or web address]  Consent: (Patient) Desiree Cruz provided verbal consent for this virtual visit at the beginning of the encounter.  Current Medications:  Current Outpatient Medications:    albuterol  (VENTOLIN  HFA) 108 (90 Base) MCG/ACT inhaler, Inhale 1-2 puffs into the lungs every 4 (four) hours as needed for wheezing or shortness of breath., Disp: 18 g, Rfl: 0   doxycycline  (VIBRA -TABS) 100 MG tablet, Take 1 tablet (100 mg total) by mouth 2 (two) times daily for 7 days., Disp: 14 tablet, Rfl: 0   predniSONE  (STERAPRED UNI-PAK 21 TAB) 10 MG (21) TBPK tablet, Take following package directions., Disp: 21 tablet, Rfl: 0   albuterol  (PROVENTIL ) (2.5 MG/3ML) 0.083% nebulizer solution, Take 3 mLs (2.5 mg total) by nebulization every 4 (four) hours as needed for wheezing or shortness of breath., Disp: 75 mL, Rfl: 4   ARTIFICIAL TEARS 1 % ophthalmic solution, Apply to eye., Disp: , Rfl:    atorvastatin (LIPITOR) 40 MG tablet, Take 40 mg by mouth daily., Disp: , Rfl:    Blood Glucose Monitoring Suppl (ACCU-CHEK GUIDE) w/Device KIT, See admin instructions., Disp: , Rfl: 0   Budeson-Glycopyrrol-Formoterol  (BREZTRI  AEROSPHERE) 160-9-4.8 MCG/ACT AERO, Inhale 2 puffs into the lungs in the morning and at bedtime., Disp: 10.7 g, Rfl: 3   busPIRone  (BUSPAR ) 5 MG tablet, Take 1 tablet (5 mg total) by mouth 3 (three) times daily., Disp: 90 tablet, Rfl: 4   cetirizine  (ZYRTEC  ALLERGY) 10 MG tablet, Take 1 tablet (10  mg total) by mouth daily., Disp: 30 tablet, Rfl: 2   gabapentin  (NEURONTIN ) 300 MG capsule, TAKE ONE CAPSULE BY MOUTH THREE TIMES DAILY, Disp: 90 capsule, Rfl: 3   ibuprofen  (ADVIL ) 800 MG tablet, Take 1 tablet (800 mg total) by mouth every 8 (eight) hours as needed., Disp: 30 tablet, Rfl: 0   loratadine  (CLARITIN ) 10 MG tablet, Take 10 mg by mouth daily., Disp: , Rfl:    montelukast  (SINGULAIR ) 10 MG tablet, Take by mouth., Disp: , Rfl:    nitroGLYCERIN (NITROSTAT) 0.4 MG SL tablet, DISSOLVE ONE TABLET UNDER THE TONGUE EVERY 5 MINUTES AS NEEDED FOR CHEST PAIN. DO NOT EXCEED A TOTAL OF 3 DOSES IN 15 MINUTES, Disp: , Rfl:    ondansetron  (ZOFRAN -ODT) 4 MG disintegrating tablet, Take 1 tablet (4 mg total) by mouth every 8 (eight) hours as needed for nausea or vomiting., Disp: 20 tablet, Rfl: 0   pantoprazole  (PROTONIX ) 40 MG tablet, Take 40 mg by mouth daily., Disp: , Rfl:    pravastatin  (PRAVACHOL ) 40 MG tablet, Take 40 mg by mouth at bedtime., Disp: , Rfl: 1   RESTASIS MULTIDOSE 0.05 % ophthalmic emulsion, , Disp: , Rfl:    sertraline  (ZOLOFT ) 100 MG tablet, Take 100 mg by mouth daily., Disp: , Rfl:    traZODone  (DESYREL ) 100 MG tablet, TAKE 1 TABLET BY MOUTH 1 HOUR BEFORE BEDTIME, Disp: , Rfl: 2   Vitamin D , Ergocalciferol , (DRISDOL ) 1.25 MG (50000 UNIT) CAPS capsule, Take 1 capsule (50,000 Units total) by mouth every  7 (seven) days., Disp: 12 capsule, Rfl: 3   Medications ordered in this encounter:  Meds ordered this encounter  Medications   doxycycline  (VIBRA -TABS) 100 MG tablet    Sig: Take 1 tablet (100 mg total) by mouth 2 (two) times daily for 7 days.    Dispense:  14 tablet    Refill:  0   predniSONE  (STERAPRED UNI-PAK 21 TAB) 10 MG (21) TBPK tablet    Sig: Take following package directions.    Dispense:  21 tablet    Refill:  0    Please dispense one standard blister pack taper.   albuterol  (VENTOLIN  HFA) 108 (90 Base) MCG/ACT inhaler    Sig: Inhale 1-2 puffs into the lungs every 4  (four) hours as needed for wheezing or shortness of breath.    Dispense:  18 g    Refill:  0     *If you need refills on other medications prior to your next appointment, please contact your pharmacy*  Follow-Up: Call back or seek an in-person evaluation if the symptoms worsen or if the condition fails to improve as anticipated.  [facility] [phone]  Other Instructions Asthma, Adult  Asthma is a condition that causes swelling and narrowing of the airways. These are the passages that lead from the nose and mouth down into the lungs. When asthma symptoms get worse it is called an asthma attack or flare. This can make it hard to breathe. Asthma flares can range from minor to life-threatening. There is no cure for asthma, but medicines and lifestyle changes can help to control it. What are the causes? It is not known exactly what causes asthma, but certain things can cause asthma symptoms to get worse (triggers). What can trigger an asthma attack? Cigarette smoke. Mold. Dust. Your pet's skin flakes (dander). Cockroaches. Pollen. Air pollution (like household cleaners, wood smoke, smog, or therapist, occupational). What are the signs or symptoms? Trouble breathing (shortness of breath). Coughing. Making high-pitched whistling sounds when you breathe, most often when you breathe out (wheezing). Chest tightness. Tiredness with little activity. Poor exercise tolerance. How is this treated? Controller medicines that help prevent asthma symptoms. Fast-acting reliever or rescue medicines. These give short-term relief of asthma symptoms. Allergy medicines if your attacks are brought on by allergens. Medicines to help control the body's defense (immune) system. Staying away from the things that cause asthma attacks. Follow these instructions at home: Avoiding triggers in your home Do not allow anyone to smoke in your home. Limit use of fireplaces and wood stoves. Get rid of  pests (such as roaches and mice) and their droppings. Keep your home clean. Clean your floors. Dust regularly. Use cleaning products that do not smell. Wash bed sheets and blankets every week in hot water. Dry them in a dryer. Have someone vacuum when you are not home. Change your heating and air conditioning filters often. Use blankets that are made of polyester or cotton. General instructions Take over-the-counter and prescription medicines only as told by your doctor. Do not smoke or use any products that contain nicotine or tobacco. If you need help quitting, ask your doctor. Stay away from secondhand smoke. Avoid doing things outdoors when allergen counts are high and when air quality is low. Warm up before you exercise. Take time to cool down after exercise. Use a peak flow meter as told by your doctor. A peak flow meter is a tool that measures how well your lungs are working. Keep track  of the peak flow meter's readings. Write them down. Follow your asthma action plan. This is a written plan for taking care of your asthma and treating your attacks. Make sure you get all the shots (vaccines) that your doctor recommends. Ask your doctor about a flu shot and a pneumonia shot. Keep all follow-up visits. Contact a doctor if: You have wheezing, shortness of breath, or a cough even while taking medicine to prevent attacks. The mucus you cough up (sputum) is thicker than usual. The mucus you cough up changes from clear or white to yellow, green, gray, or is bloody. You have problems from the medicine you are taking, such as: A rash. Itching. Swelling. Trouble breathing. You need reliever medicines more than 2-3 times a week. Your peak flow reading is still at 50-79% of your personal best after following the action plan for 1 hour. You have a fever. Get help right away if: You seem to be worse and are not responding to medicine during an asthma attack. You are short of breath even at  rest. You get short of breath when doing very little activity. You have trouble eating, drinking, or talking. You have chest pain or tightness. You have a fast heartbeat. Your lips or fingernails start to turn blue. You are light-headed or dizzy, or you faint. Your peak flow is less than 50% of your personal best. You feel too tired to breathe normally. These symptoms may be an emergency. Get help right away. Call 911. Do not wait to see if the symptoms will go away. Do not drive yourself to the hospital. Summary Asthma is a long-term (chronic) condition in which the airways get tight and narrow. An asthma attack can make it hard to breathe. Asthma cannot be cured, but medicines and lifestyle changes can help control it. Make sure you understand how to avoid triggers and how and when to use your medicines. Avoid things that can cause allergy symptoms (allergens). These include animal skin flakes (dander) and pollen from trees or grass. Avoid things that pollute the air. These may include household cleaners, wood smoke, smog, or chemical odors. This information is not intended to replace advice given to you by your health care provider. Make sure you discuss any questions you have with your health care provider. Document Revised: 01/18/2021 Document Reviewed: 01/18/2021 Elsevier Patient Education  2024 Elsevier Inc.    If you have been instructed to have an in-person evaluation today at a local Urgent Care facility, please use the link below. It will take you to a list of all of our available [facility], including address, phone number and hours of operation. Please do not delay care.  [facility]  If you or a family member do not have a primary care provider, use the link below to schedule a visit and establish care. When you choose a [facility] primary care physician or advanced practice provider, you gain a long-term partner in health. Find a Primary Care  Provider  Learn more about [facility]'s in-office and virtual care options:  - Get Care Now

## 2023-04-26 NOTE — Progress Notes (Signed)
 Virtual Visit Consent   Desiree Cruz, you are scheduled for a virtual visit with a [facility] provider today. Just as with appointments in the office, your consent must be obtained to participate. Your consent will be active for this visit and any virtual visit you may have with one of our providers in the next 365 days. If you have a MyChart account, a copy of this consent can be sent to you electronically.  As this is a virtual visit, video technology does not allow for your provider to perform a traditional examination. This may limit your provider's ability to fully assess your condition. If your provider identifies any concerns that need to be evaluated in person or the need to arrange testing (such as labs, EKG, etc.), we will make arrangements to do so. Although advances in technology are sophisticated, we cannot ensure that it will always work on either your end or our end. If the connection with a video visit is poor, the visit may have to be switched to a telephone visit. With either a video or telephone visit, we are not always able to ensure that we have a secure connection.  By engaging in this virtual visit, you consent to the provision of healthcare and authorize for your insurance to be billed (if applicable) for the services provided during this visit. Depending on your insurance coverage, you may receive a charge related to this service.  I need to obtain your verbal consent now. Are you willing to proceed with your visit today? Desiree Cruz has provided verbal consent on 04/26/2023 for a virtual visit (video or telephone). Roosvelt Mater, [place]  Date: 04/26/2023 2:14 PM  Virtual Visit via Video Note   I, Roosvelt Mater, connected with  Desiree Cruz  ([id number], 1971/03/22) on 04/26/23 at  2:00 PM EST by a video-enabled telemedicine application and verified that I am speaking with the correct person using two identifiers.  Location: Patient: Virtual Visit Location Patient:  Home Provider: Virtual Visit Location Provider: Home Office   I discussed the limitations of evaluation and management by telemedicine and the availability of in person appointments. The patient expressed understanding and agreed to proceed.    History of Present Illness: Desiree Cruz is a 53 y.o. who identifies as a female who was assigned female at birth, and is being seen today for c/o being sick for over 3 weeks.  Pt states she is having coughing and wheezing.  Pt states at night time it is worse.  Pt states when she coughs her chest rattles and has some difficulty breathing.  Pt states using inhaler more often.  Pt states taking over the counter cough syrup and has has albuterol  neb solution and has been using it more.  Pt states she tries to cough up something but nothing comes up.  Pt denies fever, chills, N/V. Pt states she has an upcoming appointment with PCP.   HPI: HPI  Problems:  Patient Active Problem List   Diagnosis Date Noted   Type 2 diabetes mellitus with hyperglycemia (HCC) 02/13/2023   Essential hypertension, benign 01/02/2023   Mixed hyperlipidemia 01/02/2023   B12 deficiency due to diet 01/02/2023   Vitamin D  deficiency, unspecified 01/02/2023   Depression 12/27/2017   Diabetic polyneuropathy associated with type 2 diabetes mellitus (HCC) 11/10/2017   Tobacco use disorder 05/16/2013   Nausea 12/24/2012   Sleep disturbance 12/24/2012   Major depressive disorder, recurrent episode (HCC) 12/08/2011   Posttraumatic stress disorder 12/08/2011  Allergies:  Allergies  Allergen Reactions   Latex Rash   Penicillins Rash   Medications:  Current Outpatient Medications:    albuterol  (VENTOLIN  HFA) 108 (90 Base) MCG/ACT inhaler, Inhale 1-2 puffs into the lungs every 4 (four) hours as needed for wheezing or shortness of breath., Disp: 18 g, Rfl: 0   doxycycline  (VIBRA -TABS) 100 MG tablet, Take 1 tablet (100 mg total) by mouth 2 (two) times daily for 7 days., Disp: 14  tablet, Rfl: 0   predniSONE  (STERAPRED UNI-PAK 21 TAB) 10 MG (21) TBPK tablet, Take following package directions., Disp: 21 tablet, Rfl: 0   albuterol  (PROVENTIL ) (2.5 MG/3ML) 0.083% nebulizer solution, Take 3 mLs (2.5 mg total) by nebulization every 4 (four) hours as needed for wheezing or shortness of breath., Disp: 75 mL, Rfl: 4   ARTIFICIAL TEARS 1 % ophthalmic solution, Apply to eye., Disp: , Rfl:    atorvastatin (LIPITOR) 40 MG tablet, Take 40 mg by mouth daily., Disp: , Rfl:    Blood Glucose Monitoring Suppl (ACCU-CHEK GUIDE) w/Device KIT, See admin instructions., Disp: , Rfl: 0   Budeson-Glycopyrrol-Formoterol  (BREZTRI  AEROSPHERE) 160-9-4.8 MCG/ACT AERO, Inhale 2 puffs into the lungs in the morning and at bedtime., Disp: 10.7 g, Rfl: 3   busPIRone  (BUSPAR ) 5 MG tablet, Take 1 tablet (5 mg total) by mouth 3 (three) times daily., Disp: 90 tablet, Rfl: 4   cetirizine  (ZYRTEC  ALLERGY) 10 MG tablet, Take 1 tablet (10 mg total) by mouth daily., Disp: 30 tablet, Rfl: 2   gabapentin  (NEURONTIN ) 300 MG capsule, TAKE ONE CAPSULE BY MOUTH THREE TIMES DAILY, Disp: 90 capsule, Rfl: 3   ibuprofen  (ADVIL ) 800 MG tablet, Take 1 tablet (800 mg total) by mouth every 8 (eight) hours as needed., Disp: 30 tablet, Rfl: 0   loratadine  (CLARITIN ) 10 MG tablet, Take 10 mg by mouth daily., Disp: , Rfl:    montelukast  (SINGULAIR ) 10 MG tablet, Take by mouth., Disp: , Rfl:    nitroGLYCERIN (NITROSTAT) 0.4 MG SL tablet, DISSOLVE ONE TABLET UNDER THE TONGUE EVERY 5 MINUTES AS NEEDED FOR CHEST PAIN. DO NOT EXCEED A TOTAL OF 3 DOSES IN 15 MINUTES, Disp: , Rfl:    ondansetron  (ZOFRAN -ODT) 4 MG disintegrating tablet, Take 1 tablet (4 mg total) by mouth every 8 (eight) hours as needed for nausea or vomiting., Disp: 20 tablet, Rfl: 0   pantoprazole  (PROTONIX ) 40 MG tablet, Take 40 mg by mouth daily., Disp: , Rfl:    pravastatin  (PRAVACHOL ) 40 MG tablet, Take 40 mg by mouth at bedtime., Disp: , Rfl: 1   RESTASIS MULTIDOSE 0.05  % ophthalmic emulsion, , Disp: , Rfl:    sertraline  (ZOLOFT ) 100 MG tablet, Take 100 mg by mouth daily., Disp: , Rfl:    traZODone  (DESYREL ) 100 MG tablet, TAKE 1 TABLET BY MOUTH 1 HOUR BEFORE BEDTIME, Disp: , Rfl: 2   Vitamin D , Ergocalciferol , (DRISDOL ) 1.25 MG (50000 UNIT) CAPS capsule, Take 1 capsule (50,000 Units total) by mouth every 7 (seven) days., Disp: 12 capsule, Rfl: 3  Observations/Objective: Patient is well-developed, well-nourished in no acute distress.  Resting comfortably at home.  Head is normocephalic, atraumatic.  No labored breathing.  Speech is clear and coherent with logical content.  Patient is alert and oriented at baseline.    Assessment and Plan: 1. Exacerbation of asthma, unspecified asthma severity, unspecified whether persistent (Primary) - doxycycline  (VIBRA -TABS) 100 MG tablet; Take 1 tablet (100 mg total) by mouth 2 (two) times daily for 7 days.  Dispense: 14  tablet; Refill: 0 - predniSONE  (STERAPRED UNI-PAK 21 TAB) 10 MG (21) TBPK tablet; Take following package directions.  Dispense: 21 tablet; Refill: 0  -Treating Pt for Asthma exacerbation  -Pt to F/U with PCP as scheduled.   Follow Up Instructions: I discussed the assessment and treatment plan with the patient. The patient was provided an opportunity to ask questions and all were answered. The patient agreed with the plan and demonstrated an understanding of the instructions.  A copy of instructions were sent to the patient via MyChart unless otherwise noted below.     The patient was advised to call back or seek an in-person evaluation if the symptoms worsen or if the condition fails to improve as anticipated.    Roosvelt Mater, PA-C

## 2023-05-17 ENCOUNTER — Ambulatory Visit: Payer: Medicaid Other | Admitting: Family

## 2023-05-17 ENCOUNTER — Encounter: Payer: Self-pay | Admitting: Family

## 2023-05-17 VITALS — BP 122/76 | HR 87 | Ht 69.0 in | Wt 224.8 lb

## 2023-05-17 DIAGNOSIS — R5383 Other fatigue: Secondary | ICD-10-CM

## 2023-05-17 DIAGNOSIS — R300 Dysuria: Secondary | ICD-10-CM

## 2023-05-17 DIAGNOSIS — E538 Deficiency of other specified B group vitamins: Secondary | ICD-10-CM

## 2023-05-17 DIAGNOSIS — E782 Mixed hyperlipidemia: Secondary | ICD-10-CM

## 2023-05-17 DIAGNOSIS — E559 Vitamin D deficiency, unspecified: Secondary | ICD-10-CM

## 2023-05-17 DIAGNOSIS — N760 Acute vaginitis: Secondary | ICD-10-CM

## 2023-05-17 DIAGNOSIS — I10 Essential (primary) hypertension: Secondary | ICD-10-CM

## 2023-05-17 DIAGNOSIS — R310 Gross hematuria: Secondary | ICD-10-CM

## 2023-05-17 DIAGNOSIS — F331 Major depressive disorder, recurrent, moderate: Secondary | ICD-10-CM

## 2023-05-17 DIAGNOSIS — E1165 Type 2 diabetes mellitus with hyperglycemia: Secondary | ICD-10-CM

## 2023-05-17 DIAGNOSIS — E1142 Type 2 diabetes mellitus with diabetic polyneuropathy: Secondary | ICD-10-CM

## 2023-05-17 LAB — POCT URINALYSIS DIPSTICK
Blood, UA: POSITIVE
Glucose, UA: NEGATIVE
Ketones, UA: POSITIVE
Nitrite, UA: NEGATIVE
Protein, UA: POSITIVE — AB
Spec Grav, UA: >=1.03 — AB (ref 1.010–1.025)
Urobilinogen, UA: 0.2 U/dL
pH, UA: 6 (ref 5.0–8.0)

## 2023-05-17 LAB — POC CREATINE & ALBUMIN,URINE
Creatinine, POC: 200 mg/dL
Microalbumin Ur, POC: 150 mg/L

## 2023-05-17 MED ORDER — ACCU-CHEK SOFTCLIX LANCETS MISC: 12 refills | Status: AC

## 2023-05-17 MED ORDER — LORATADINE 10 MG PO TABS: 10.0000 mg | ORAL_TABLET | Freq: Every day | ORAL | 1 refills | Status: AC

## 2023-05-17 MED ORDER — PRAVASTATIN SODIUM 40 MG PO TABS: 40.0000 mg | ORAL_TABLET | Freq: Every day | ORAL | 1 refills | Status: AC

## 2023-05-17 MED ORDER — PREDNISONE 20 MG PO TABS: 40.0000 mg | ORAL_TABLET | Freq: Every day | ORAL | 0 refills | Status: AC

## 2023-05-17 MED ORDER — LEVOFLOXACIN 500 MG PO TABS: 500.0000 mg | ORAL_TABLET | Freq: Every day | ORAL | 0 refills | Status: AC

## 2023-05-17 MED ORDER — MONTELUKAST SODIUM 10 MG PO TABS: 10.0000 mg | ORAL_TABLET | Freq: Every day | ORAL | 1 refills | Status: AC

## 2023-05-17 MED ORDER — SERTRALINE HCL 100 MG PO TABS: 100.0000 mg | ORAL_TABLET | Freq: Every day | ORAL | 1 refills | Status: AC

## 2023-05-17 MED ORDER — VITAMIN D (ERGOCALCIFEROL) 1.25 MG (50000 UNIT) PO CAPS: 50000.0000 [IU] | ORAL_CAPSULE | ORAL | 3 refills | Status: AC

## 2023-05-17 MED ORDER — PANTOPRAZOLE SODIUM 40 MG PO TBEC: 40.0000 mg | DELAYED_RELEASE_TABLET | Freq: Every day | ORAL | 1 refills | Status: AC

## 2023-05-17 MED ORDER — HYDROXYZINE HCL 10 MG PO TABS: 10.0000 mg | ORAL_TABLET | Freq: Three times a day (TID) | ORAL | 0 refills | Status: AC | PRN

## 2023-05-17 MED ORDER — TRAZODONE HCL 100 MG PO TABS: 100.0000 mg | ORAL_TABLET | Freq: Every evening | ORAL | 1 refills | Status: AC | PRN

## 2023-05-17 MED ORDER — GABAPENTIN 300 MG PO CAPS: 300.0000 mg | ORAL_CAPSULE | Freq: Three times a day (TID) | ORAL | 3 refills | Status: AC

## 2023-05-17 MED ORDER — BUSPIRONE HCL 5 MG PO TABS: 5.0000 mg | ORAL_TABLET | Freq: Three times a day (TID) | ORAL | 4 refills | Status: AC

## 2023-05-17 MED ORDER — ACCU-CHEK GUIDE TEST VI STRP: ORAL_STRIP | 12 refills | Status: AC

## 2023-05-17 MED ORDER — ONDANSETRON 4 MG PO TBDP: 4.0000 mg | ORAL_TABLET | Freq: Three times a day (TID) | ORAL | 0 refills | Status: AC | PRN

## 2023-05-17 MED ORDER — ACCU-CHEK GUIDE W/DEVICE KIT: 1.0000 | PACK | 0 refills | Status: AC

## 2023-05-17 MED ORDER — ALBUTEROL SULFATE (2.5 MG/3ML) 0.083% IN NEBU: 2.5000 mg | INHALATION_SOLUTION | RESPIRATORY_TRACT | 4 refills | Status: AC | PRN

## 2023-05-17 NOTE — Assessment & Plan Note (Signed)
Blood pressure well controlled with current medications.  Continue current therapy.  Will reassess at follow up.  

## 2023-05-17 NOTE — Assessment & Plan Note (Signed)
Checking labs today.  Will continue supplements as needed.  

## 2023-05-17 NOTE — Assessment & Plan Note (Signed)
Checking labs today.  Continue current therapy for lipid control. Will modify as needed based on labwork results.  

## 2023-05-17 NOTE — Assessment & Plan Note (Signed)
Checking labs today. Will call pt. With results  Continue current diabetes POC, as patient has been well controlled on current regimen.  Will adjust meds if needed based on labs.  

## 2023-05-17 NOTE — Progress Notes (Signed)
Established Patient Office Visit  Subjective:  Patient ID: Desiree Cruz, female    DOB: 06-27-1970  Age: 53 y.o. MRN: [id number]  Chief Complaint  Patient presents with   Follow-up    3 month follow up    Patient is here today for her 3 months follow up.  She has been feeling fairly well since last appointment.   She does have additional concerns to discuss today.   Asthma She complains of chest tightness, cough, hoarse voice, shortness of breath, sputum production and wheezing. This is a recurrent problem. The current episode started 1 to 4 weeks ago. The problem occurs 2 to 4 times per day. The problem has been gradually worsening. The cough is productive, productive of sputum and productive of purulent sputum (green sputum). Associated symptoms include dyspnea on exertion, malaise/fatigue, nasal congestion, postnasal drip and rhinorrhea. Her symptoms are aggravated by climbing stairs, change in weather, any activity, exposure to smoke, strenuous activity, URI, exercise and minimal activity. Her past medical history is significant for asthma.  Urinary Tract Infection  This is a new problem. The current episode started 1 to 4 weeks ago. The problem occurs every urination. The problem has been gradually worsening. The quality of the pain is described as burning. The pain is at a severity of 10/10. The pain is severe. There has been no fever. She is Not sexually active. There is No history of pyelonephritis. Associated symptoms include a discharge, flank pain, frequency, hematuria and urgency. Associated symptoms comments: odor. She has tried increased fluids for the symptoms. The treatment provided no relief. Her past medical history is significant for recurrent UTIs.  Vaginal Discharge The patient's primary symptoms include a genital odor and vaginal discharge. This is a new problem. The current episode started 1 to 4 weeks ago. Associated symptoms include flank pain, frequency,  hematuria and urgency. The vaginal discharge was white. There has been no bleeding. Nothing aggravates the symptoms. She has tried nothing for the symptoms. She is not sexually active. No, her partner does not have an STD. She uses abstinence for contraception.    No other concerns at this time.   Past Medical History:  Diagnosis Date   Asthma    CHF (congestive heart failure) (HCC)    Coronary artery disease    Diabetes mellitus without complication (HCC)    Hypertension    Inverted nipple    recent Bil nipple inversion 03/18   Migraines     Past Surgical History:  Procedure Laterality Date   ABDOMINAL HYSTERECTOMY      Social History   Socioeconomic History   Marital status: Legally Separated    Spouse name: Not on file   Number of children: Not on file   Years of education: Not on file   Highest education level: Not on file  Occupational History   Not on file  Tobacco Use   Smoking status: Some Days    Current packs/day: 0.10    Types: Cigarettes   Smokeless tobacco: Never  Substance and Sexual Activity   Alcohol use: No    Alcohol/week: 0.0 standard drinks of alcohol   Drug use: Never   Sexual activity: Not on file  Other Topics Concern   Not on file  Social History Narrative   Not on file   Social Drivers of Health   Financial Resource Strain: Not on file  Food Insecurity: Not on file  Transportation Needs: Not on file  Physical Activity: Not on file  Stress: Not on file  Social Connections: Not on file  Intimate Partner Violence: Not on file    Family History  Problem Relation Age of Onset   Breast cancer Other     Allergies  Allergen Reactions   Latex Rash   Penicillins Rash    Review of Systems  Constitutional:  Positive for malaise/fatigue.  HENT:  Positive for congestion, hoarse voice, postnasal drip, rhinorrhea and sinus pain.   Respiratory:  Positive for cough, sputum production, shortness of breath and wheezing.   Cardiovascular:   Positive for dyspnea on exertion.  Genitourinary:  Positive for flank pain, frequency, hematuria, urgency and vaginal discharge.       Vaginal odor.  All other systems reviewed and are negative.      Objective:   BP 122/76   Pulse 87   Ht 5\' 9"  (1.753 m)   Wt 224 lb 12.8 oz (102 kg)   SpO2 97%   BMI 33.20 kg/m   Vitals:   05/17/23 0901  BP: 122/76  Pulse: 87  Height: 5\' 9"  (1.753 m)  Weight: 224 lb 12.8 oz (102 kg)  SpO2: 97%  BMI (Calculated): 33.18    Physical Exam Vitals and nursing note reviewed.  Constitutional:      Appearance: Normal appearance. She is normal weight.  HENT:     Head: Normocephalic.  Eyes:     Extraocular Movements: Extraocular movements intact.     Conjunctiva/sclera: Conjunctivae normal.     Pupils: Pupils are equal, round, and reactive to light.  Cardiovascular:     Rate and Rhythm: Normal rate.  Pulmonary:     Effort: Pulmonary effort is normal.     Breath sounds: Wheezing present.  Neurological:     General: No focal deficit present.     Mental Status: She is alert and oriented to person, place, and time. Mental status is at baseline.  Psychiatric:        Mood and Affect: Mood normal.        Behavior: Behavior normal.        Thought Content: Thought content normal.        Judgment: Judgment normal.      Results for orders placed or performed in visit on 05/17/23  Urine Culture   Specimen: Urine, Clean Catch   UR  Result Value Ref Range   Urine Culture, Routine Final report    Organism ID, Bacteria Comment   Microscopic Examination  Result Value Ref Range   WBC, UA 6-10 (A) 0 - 5 /hpf   RBC, Urine 3-10 (A) 0 - 2 /hpf   Epithelial Cells (non renal) 0-10 0 - 10 /hpf   Casts None seen None seen /lpf   Crystals Present (A) N/A   Crystal Type Calcium Oxalate N/A   Bacteria, UA Moderate (A) None seen/Few  Lipid panel  Result Value Ref Range   Cholesterol, Total 186 100 - 199 mg/dL   Triglycerides 161 0 - 149 mg/dL   HDL  44 >09 mg/dL   VLDL Cholesterol Cal 23 5 - 40 mg/dL   LDL Chol Calc (NIH) 604 (H) 0 - 99 mg/dL   Chol/HDL Ratio 4.2 0.0 - 4.4 ratio  VITAMIN D 25 Hydroxy (Vit-D Deficiency, Fractures)  Result Value Ref Range   Vit D, 25-Hydroxy 12.4 (L) 30.0 - 100.0 ng/mL  CMP14+EGFR  Result Value Ref Range   Glucose 88 70 - 99 mg/dL   BUN 9 6 - 24 mg/dL   Creatinine,  Ser 0.82 0.57 - 1.00 mg/dL   eGFR 85 >30 QM/VHQ/4.69   BUN/Creatinine Ratio 11 9 - 23   Sodium 143 134 - 144 mmol/L   Potassium 4.3 3.5 - 5.2 mmol/L   Chloride 105 96 - 106 mmol/L   CO2 22 20 - 29 mmol/L   Calcium 9.0 8.7 - 10.2 mg/dL   Total Protein 5.6 (L) 6.0 - 8.5 g/dL   Albumin 3.8 3.8 - 4.9 g/dL   Globulin, Total 1.8 1.5 - 4.5 g/dL   Bilirubin Total 0.4 0.0 - 1.2 mg/dL   Alkaline Phosphatase 42 (L) 44 - 121 IU/L   AST 16 0 - 40 IU/L   ALT 13 0 - 32 IU/L  TSH  Result Value Ref Range   TSH 2.210 0.450 - 4.500 uIU/mL  Hemoglobin A1c  Result Value Ref Range   Hgb A1c MFr Bld 5.6 4.8 - 5.6 %   Est. average glucose Bld gHb Est-mCnc 114 mg/dL  Vitamin G29  Result Value Ref Range   Vitamin B-12 361 232 - 1,245 pg/mL  CBC with Diff  Result Value Ref Range   WBC 8.8 3.4 - 10.8 x10E3/uL   RBC 4.71 3.77 - 5.28 x10E6/uL   Hemoglobin 14.9 11.1 - 15.9 g/dL   Hematocrit 52.8 41.3 - 46.6 %   MCV 94 79 - 97 fL   MCH 31.6 26.6 - 33.0 pg   MCHC 33.6 31.5 - 35.7 g/dL   RDW 24.4 01.0 - 27.2 %   Platelets 254 150 - 450 x10E3/uL   Neutrophils 67 Not Estab. %   Lymphs 24 Not Estab. %   Monocytes 6 Not Estab. %   Eos 2 Not Estab. %   Basos 1 Not Estab. %   Neutrophils Absolute 5.9 1.4 - 7.0 x10E3/uL   Lymphocytes Absolute 2.1 0.7 - 3.1 x10E3/uL   Monocytes Absolute 0.5 0.1 - 0.9 x10E3/uL   EOS (ABSOLUTE) 0.2 0.0 - 0.4 x10E3/uL   Basophils Absolute 0.1 0.0 - 0.2 x10E3/uL   Immature Granulocytes 0 Not Estab. %   Immature Grans (Abs) 0.0 0.0 - 0.1 x10E3/uL  Urinalysis, Routine w reflex microscopic  Result Value Ref Range   Specific  Gravity, UA      >=1.030 (A) 1.005 - 1.030   pH, UA 5.0 5.0 - 7.5   Color, UA Yellow Yellow   Appearance Ur Turbid (A) Clear   Leukocytes,UA 1+ (A) Negative   Protein,UA 1+ (A) Negative/Trace   Glucose, UA Negative Negative   Ketones, UA Trace (A) Negative   RBC, UA 1+ (A) Negative   Bilirubin, UA CANCELED    Urobilinogen, Ur 1.0 0.2 - 1.0 mg/dL   Nitrite, UA Positive (A) Negative   Microscopic Examination See below:   POCT Urinalysis Dipstick (53664)  Result Value Ref Range   Color, UA orange/red    Clarity, UA cloudy    Glucose, UA Negative Negative   Bilirubin, UA 1+    Ketones, UA pos    Spec Grav, UA >=1.030 (A) 1.010 - 1.025   Blood, UA pos    pH, UA 6.0 5.0 - 8.0   Protein, UA Positive (A) Negative   Urobilinogen, UA 0.2 0.2 or 1.0 E.U./dL   Nitrite, UA neg    Leukocytes, UA Trace (A) Negative   Appearance cloudy    Odor yes   POC CREATINE & ALBUMIN,URINE  Result Value Ref Range   Microalbumin Ur, POC 150 mg/L   Creatinine, POC 200 mg/dL   Albumin/Creatinine  Ratio, Urine, POC 30-300     Recent Results (from the past 2160 hours)  Urine Culture     Status: None   Collection Time: 05/17/23  9:00 AM   Specimen: Urine, Clean Catch   UR  Result Value Ref Range   Urine Culture, Routine Final report    Organism ID, Bacteria Comment     Comment: Mixed urogenital flora 25,000-50,000 colony forming units per mL   POCT Urinalysis Dipstick (54098)     Status: Abnormal   Collection Time: 05/17/23  9:39 AM  Result Value Ref Range   Color, UA orange/red    Clarity, UA cloudy    Glucose, UA Negative Negative   Bilirubin, UA 1+    Ketones, UA pos    Spec Grav, UA >=1.030 (A) 1.010 - 1.025   Blood, UA pos    pH, UA 6.0 5.0 - 8.0   Protein, UA Positive (A) Negative   Urobilinogen, UA 0.2 0.2 or 1.0 E.U./dL   Nitrite, UA neg    Leukocytes, UA Trace (A) Negative   Appearance cloudy    Odor yes   POC CREATINE & ALBUMIN,URINE     Status: None   Collection Time:  05/17/23  9:39 AM  Result Value Ref Range   Microalbumin Ur, POC 150 mg/L   Creatinine, POC 200 mg/dL   Albumin/Creatinine Ratio, Urine, POC 30-300   Lipid panel     Status: Abnormal   Collection Time: 05/17/23  9:49 AM  Result Value Ref Range   Cholesterol, Total 186 100 - 199 mg/dL   Triglycerides 119 0 - 149 mg/dL   HDL 44 >14 mg/dL   VLDL Cholesterol Cal 23 5 - 40 mg/dL   LDL Chol Calc (NIH) 782 (H) 0 - 99 mg/dL   Chol/HDL Ratio 4.2 0.0 - 4.4 ratio    Comment:                                   T. Chol/HDL Ratio                                             Men  Women                               1/2 Avg.Risk  3.4    3.3                                   Avg.Risk  5.0    4.4                                2X Avg.Risk  9.6    7.1                                3X Avg.Risk 23.4   11.0   VITAMIN D 25 Hydroxy (Vit-D Deficiency, Fractures)     Status: Abnormal   Collection Time: 05/17/23  9:49 AM  Result Value Ref Range   Vit D, 25-Hydroxy 12.4 (L) 30.0 - 100.0 ng/mL    Comment:  Vitamin D deficiency has been defined by the Institute of Medicine and an Endocrine Society practice guideline as a level of serum 25-OH vitamin D less than 20 ng/mL (1,2). The Endocrine Society went on to further define vitamin D insufficiency as a level between 21 and 29 ng/mL (2). 1. IOM (Institute of Medicine). 2010. Dietary reference    intakes for calcium and D. [place]: The    [employer]. 2. Holick MF, Binkley [place], Bischoff-Ferrari HA, et al.    Evaluation, treatment, and prevention of vitamin D    deficiency: an Endocrine Society clinical practice    guideline. JCEM. 2011 Jul; 96(7):1911-30.   CMP14+EGFR     Status: Abnormal   Collection Time: 05/17/23  9:49 AM  Result Value Ref Range   Glucose 88 70 - 99 mg/dL   BUN 9 6 - 24 mg/dL   Creatinine, Ser 1.61 0.57 - 1.00 mg/dL   eGFR 85 >09 UE/AVW/0.98   BUN/Creatinine Ratio 11 9 - 23   Sodium 143 134 - 144 mmol/L   Potassium  4.3 3.5 - 5.2 mmol/L   Chloride 105 96 - 106 mmol/L   CO2 22 20 - 29 mmol/L   Calcium 9.0 8.7 - 10.2 mg/dL   Total Protein 5.6 (L) 6.0 - 8.5 g/dL   Albumin 3.8 3.8 - 4.9 g/dL   Globulin, Total 1.8 1.5 - 4.5 g/dL   Bilirubin Total 0.4 0.0 - 1.2 mg/dL   Alkaline Phosphatase 42 (L) 44 - 121 IU/L   AST 16 0 - 40 IU/L   ALT 13 0 - 32 IU/L  TSH     Status: None   Collection Time: 05/17/23  9:49 AM  Result Value Ref Range   TSH 2.210 0.450 - 4.500 uIU/mL  Hemoglobin A1c     Status: None   Collection Time: 05/17/23  9:49 AM  Result Value Ref Range   Hgb A1c MFr Bld 5.6 4.8 - 5.6 %    Comment:          Prediabetes: 5.7 - 6.4          Diabetes: >6.4          Glycemic control for adults with diabetes: <7.0    Est. average glucose Bld gHb Est-mCnc 114 mg/dL  Vitamin J19     Status: None   Collection Time: 05/17/23  9:49 AM  Result Value Ref Range   Vitamin B-12 361 232 - 1,245 pg/mL  CBC with Diff     Status: None   Collection Time: 05/17/23  9:49 AM  Result Value Ref Range   WBC 8.8 3.4 - 10.8 x10E3/uL   RBC 4.71 3.77 - 5.28 x10E6/uL   Hemoglobin 14.9 11.1 - 15.9 g/dL   Hematocrit 14.7 82.9 - 46.6 %   MCV 94 79 - 97 fL   MCH 31.6 26.6 - 33.0 pg   MCHC 33.6 31.5 - 35.7 g/dL   RDW 56.2 13.0 - 86.5 %   Platelets 254 150 - 450 x10E3/uL   Neutrophils 67 Not Estab. %   Lymphs 24 Not Estab. %   Monocytes 6 Not Estab. %   Eos 2 Not Estab. %   Basos 1 Not Estab. %   Neutrophils Absolute 5.9 1.4 - 7.0 x10E3/uL   Lymphocytes Absolute 2.1 0.7 - 3.1 x10E3/uL   Monocytes Absolute 0.5 0.1 - 0.9 x10E3/uL   EOS (ABSOLUTE) 0.2 0.0 - 0.4 x10E3/uL   Basophils Absolute 0.1 0.0 - 0.2 x10E3/uL  Immature Granulocytes 0 Not Estab. %   Immature Grans (Abs) 0.0 0.0 - 0.1 x10E3/uL  Urinalysis, Routine w reflex microscopic     Status: Abnormal   Collection Time: 05/17/23 10:02 AM  Result Value Ref Range   Specific Gravity, UA      >=1.030 (A) 1.005 - 1.030   pH, UA 5.0 5.0 - 7.5   Color, UA Yellow  Yellow   Appearance Ur Turbid (A) Clear   Leukocytes,UA 1+ (A) Negative   Protein,UA 1+ (A) Negative/Trace   Glucose, UA Negative Negative   Ketones, UA Trace (A) Negative   RBC, UA 1+ (A) Negative   Bilirubin, UA CANCELED     Comment: Test not performed. Unable to perform test due to current unavailability of reagents.  Result canceled by the ancillary.    Urobilinogen, Ur 1.0 0.2 - 1.0 mg/dL   Nitrite, UA Positive (A) Negative   Microscopic Examination See below:     Comment: Microscopic was indicated and was performed.  Microscopic Examination     Status: Abnormal   Collection Time: 05/17/23 10:02 AM  Result Value Ref Range   WBC, UA 6-10 (A) 0 - 5 /hpf   RBC, Urine 3-10 (A) 0 - 2 /hpf   Epithelial Cells (non renal) 0-10 0 - 10 /hpf   Casts None seen None seen /lpf   Crystals Present (A) N/A   Crystal Type Calcium Oxalate N/A   Bacteria, UA Moderate (A) None seen/Few       Assessment & Plan:   Problem List Items Addressed This Visit       Cardiovascular and Mediastinum   Essential hypertension, benign   Blood pressure well controlled with current medications.  Continue current therapy.  Will reassess at follow up.        Relevant Medications   pravastatin (PRAVACHOL) 40 MG tablet   Other Relevant Orders   CMP14+EGFR (Completed)   CBC with Diff (Completed)     Endocrine   Diabetic polyneuropathy associated with type 2 diabetes mellitus (HCC)   Patient stable.  Well controlled with current therapy.   Continue current meds.        Relevant Medications   traZODone (DESYREL) 100 MG tablet   sertraline (ZOLOFT) 100 MG tablet   busPIRone (BUSPAR) 5 MG tablet   gabapentin (NEURONTIN) 300 MG capsule   pravastatin (PRAVACHOL) 40 MG tablet   hydrOXYzine (ATARAX) 10 MG tablet   Other Relevant Orders   CMP14+EGFR (Completed)   CBC with Diff (Completed)   Type 2 diabetes mellitus with hyperglycemia (HCC) - Primary   Checking labs today. Will call pt. With  results  Continue current diabetes POC, as patient has been well controlled on current regimen.  Will adjust meds if needed based on labs.        Relevant Medications   pravastatin (PRAVACHOL) 40 MG tablet   Other Relevant Orders   POC CREATINE & ALBUMIN,URINE (Completed)   CMP14+EGFR (Completed)   Hemoglobin A1c (Completed)   CBC with Diff (Completed)     Other   Major depressive disorder, recurrent episode (HCC)   Patient stable.  Well controlled with current therapy.   Continue current meds.        Relevant Medications   traZODone (DESYREL) 100 MG tablet   sertraline (ZOLOFT) 100 MG tablet   busPIRone (BUSPAR) 5 MG tablet   hydrOXYzine (ATARAX) 10 MG tablet   Other Relevant Orders   CMP14+EGFR (Completed)   CBC with Diff (Completed)  Mixed hyperlipidemia   Checking labs today.  Continue current therapy for lipid control. Will modify as needed based on labwork results.        Relevant Medications   pravastatin (PRAVACHOL) 40 MG tablet   Other Relevant Orders   Lipid panel (Completed)   CMP14+EGFR (Completed)   CBC with Diff (Completed)   B12 deficiency due to diet   Checking labs today.  Will continue supplements as needed.        Relevant Orders   CMP14+EGFR (Completed)   Vitamin B12 (Completed)   CBC with Diff (Completed)   Vitamin D deficiency, unspecified   Checking labs today.  Will continue supplements as needed.        Relevant Orders   VITAMIN D 25 Hydroxy (Vit-D Deficiency, Fractures) (Completed)   CMP14+EGFR (Completed)   CBC with Diff (Completed)   Other Visit Diagnoses       Burning with urination       Relevant Orders   POCT Urinalysis Dipstick (81002) (Completed)   CMP14+EGFR (Completed)   CBC with Diff (Completed)   Urinalysis, Routine w reflex microscopic (Completed)   Urine Culture (Completed)     Other fatigue       Relevant Orders   CMP14+EGFR (Completed)   TSH (Completed)   CBC with Diff (Completed)     Acute  vaginitis       Nuswab sent to [facility] today.  Will call patient with results and will treat based on results.   Relevant Orders   NuSwab Vaginitis Plus (VG+)     Gross hematuria       UA in office today abnormal.  Sending to [facility] for UA/UC.   Relevant Orders   Urinalysis, Routine w reflex microscopic (Completed)   Urine Culture (Completed)       Return in about 1 month (around 06/17/2023) for F/U.   Total time spent: 30 minutes  Miki Kins, FNP  05/17/2023   This document may have been prepared by [facility] Voice Recognition software and as such may include unintentional dictation errors.

## 2023-05-18 LAB — MICROSCOPIC EXAMINATION: Casts: NONE SEEN /[LPF]

## 2023-05-18 LAB — LIPID PANEL
Chol/HDL Ratio: 4.2 {ratio} (ref 0.0–4.4)
Cholesterol, Total: 186 mg/dL (ref 100–199)
HDL: 44 mg/dL (ref >39)
LDL Chol Calc (NIH): 119 mg/dL — ABNORMAL HIGH (ref 0–99)
Triglycerides: 128 mg/dL (ref 0–149)
VLDL Cholesterol Cal: 23 mg/dL (ref 5–40)

## 2023-05-18 LAB — CBC WITH DIFFERENTIAL/PLATELET
Basophils Absolute: 0.1 10*3/uL (ref 0.0–0.2)
Basos: 1 %
EOS (ABSOLUTE): 0.2 10*3/uL (ref 0.0–0.4)
Eos: 2 %
Hematocrit: 44.4 % (ref 34.0–46.6)
Hemoglobin: 14.9 g/dL (ref 11.1–15.9)
Immature Grans (Abs): 0 10*3/uL (ref 0.0–0.1)
Immature Granulocytes: 0 %
Lymphocytes Absolute: 2.1 10*3/uL (ref 0.7–3.1)
Lymphs: 24 %
MCH: 31.6 pg (ref 26.6–33.0)
MCHC: 33.6 g/dL (ref 31.5–35.7)
MCV: 94 fL (ref 79–97)
Monocytes Absolute: 0.5 10*3/uL (ref 0.1–0.9)
Monocytes: 6 %
Neutrophils Absolute: 5.9 10*3/uL (ref 1.4–7.0)
Neutrophils: 67 %
Platelets: 254 10*3/uL (ref 150–450)
RBC: 4.71 x10E6/uL (ref 3.77–5.28)
RDW: 12 % (ref 11.7–15.4)
WBC: 8.8 10*3/uL (ref 3.4–10.8)

## 2023-05-18 LAB — URINALYSIS, ROUTINE W REFLEX MICROSCOPIC
Glucose, UA: NEGATIVE
Nitrite, UA: POSITIVE — AB
Specific Gravity, UA: >=1.03 — AB (ref 1.005–1.030)
Urobilinogen, Ur: 1 mg/dL (ref 0.2–1.0)
pH, UA: 5 (ref 5.0–7.5)

## 2023-05-18 LAB — CMP14+EGFR
ALT: 13 [IU]/L (ref 0–32)
AST: 16 [IU]/L (ref 0–40)
Albumin: 3.8 g/dL (ref 3.8–4.9)
Alkaline Phosphatase: 42 [IU]/L — ABNORMAL LOW (ref 44–121)
BUN/Creatinine Ratio: 11 (ref 9–23)
BUN: 9 mg/dL (ref 6–24)
Bilirubin Total: 0.4 mg/dL (ref 0.0–1.2)
CO2: 22 mmol/L (ref 20–29)
Calcium: 9 mg/dL (ref 8.7–10.2)
Chloride: 105 mmol/L (ref 96–106)
Creatinine, Ser: 0.82 mg/dL (ref 0.57–1.00)
Globulin, Total: 1.8 g/dL (ref 1.5–4.5)
Glucose: 88 mg/dL (ref 70–99)
Potassium: 4.3 mmol/L (ref 3.5–5.2)
Sodium: 143 mmol/L (ref 134–144)
Total Protein: 5.6 g/dL — ABNORMAL LOW (ref 6.0–8.5)
eGFR: 85 mL/min/{1.73_m2} (ref >59)

## 2023-05-18 LAB — HEMOGLOBIN A1C
Est. average glucose Bld gHb Est-mCnc: 114 mg/dL
Hgb A1c MFr Bld: 5.6 % (ref 4.8–5.6)

## 2023-05-18 LAB — VITAMIN B12: Vitamin B-12: 361 pg/mL (ref 232–1245)

## 2023-05-18 LAB — TSH: TSH: 2.21 u[IU]/mL (ref 0.450–4.500)

## 2023-05-18 LAB — VITAMIN D 25 HYDROXY (VIT D DEFICIENCY, FRACTURES): Vit D, 25-Hydroxy: 12.4 ng/mL — ABNORMAL LOW (ref 30.0–100.0)

## 2023-05-19 LAB — URINE CULTURE

## 2023-05-20 ENCOUNTER — Encounter: Payer: Self-pay | Admitting: Family

## 2023-05-20 NOTE — Assessment & Plan Note (Signed)
Patient stable.  Well controlled with current therapy.   Continue current meds.

## 2023-05-22 LAB — NUSWAB VAGINITIS PLUS (VG+)
Candida albicans, NAA: NEGATIVE
Candida glabrata, NAA: NEGATIVE
Trich vag by NAA: NEGATIVE

## 2023-05-26 ENCOUNTER — Telehealth: Payer: Self-pay | Admitting: Family

## 2023-05-26 NOTE — Telephone Encounter (Signed)
Patient returned our call regarding her lab results.

## 2023-06-15 ENCOUNTER — Other Ambulatory Visit: Payer: Self-pay

## 2023-06-15 MED ORDER — ALBUTEROL SULFATE HFA 108 (90 BASE) MCG/ACT IN AERS
1.0000 | INHALATION_SPRAY | RESPIRATORY_TRACT | 0 refills | Status: DC | PRN
Start: 2023-06-15 — End: 2023-07-10

## 2023-06-21 ENCOUNTER — Ambulatory Visit: Payer: Medicaid Other | Admitting: Family

## 2023-07-10 ENCOUNTER — Other Ambulatory Visit: Payer: Self-pay | Admitting: Family

## 2023-07-12 ENCOUNTER — Other Ambulatory Visit: Payer: Self-pay | Admitting: Family

## 2023-07-12 MED ORDER — ALBUTEROL SULFATE HFA 108 (90 BASE) MCG/ACT IN AERS
1.0000 | INHALATION_SPRAY | RESPIRATORY_TRACT | 2 refills | Status: AC | PRN
Start: 2023-07-12 — End: ?

## 2023-07-12 MED ORDER — BREZTRI AEROSPHERE 160-9-4.8 MCG/ACT IN AERO: 2.0000 | INHALATION_SPRAY | Freq: Two times a day (BID) | RESPIRATORY_TRACT | 3 refills | Status: DC

## 2023-08-21 ENCOUNTER — Telehealth: Payer: Self-pay | Admitting: Family

## 2023-08-21 NOTE — Telephone Encounter (Signed)
 Patient left VM that Medicaid will not pay for her Breztri  inhaler (PA was denied). She wants to know if we can send in a different inhaler that Medicaid will cover.

## 2023-08-29 ENCOUNTER — Other Ambulatory Visit: Payer: Self-pay

## 2023-08-29 MED ORDER — BUDESONIDE-FORMOTEROL FUMARATE 160-4.5 MCG/ACT IN AERO: 2.0000 | INHALATION_SPRAY | Freq: Two times a day (BID) | RESPIRATORY_TRACT | 3 refills | Status: DC

## 2023-08-30 ENCOUNTER — Other Ambulatory Visit: Payer: Self-pay

## 2023-08-30 ENCOUNTER — Telehealth: Payer: Self-pay | Admitting: Family

## 2023-08-30 MED ORDER — LEVOFLOXACIN 500 MG PO TABS: 500.0000 mg | ORAL_TABLET | Freq: Every day | ORAL | 0 refills | Status: AC

## 2023-08-30 MED ORDER — BUDESONIDE-FORMOTEROL FUMARATE 160-4.5 MCG/ACT IN AERO: 2.0000 | INHALATION_SPRAY | Freq: Two times a day (BID) | RESPIRATORY_TRACT | 3 refills | Status: AC

## 2023-08-30 NOTE — Telephone Encounter (Signed)
 Patient called in c/o chest pain from coughing, coughing up phlegm that is yellow/green, and has congestion. Can you send in antibiotics and prednisone  and maybe something for cough?  [facility] - [place], [place]

## 2023-10-30 IMAGING — MG MM DIGITAL SCREENING BILAT W/ TOMO AND CAD
8 series · 8 of 24 positions shown · non-contrast
Comparison: Previous exam(s).

CLINICAL DATA: Screening.

EXAM:
DIGITAL SCREENING BILATERAL MAMMOGRAM WITH TOMOSYNTHESIS AND CAD
TECHNIQUE: Bilateral screening digital craniocaudal and mediolateral oblique
mammograms were obtained. Bilateral screening digital breast
tomosynthesis was performed. The images were evaluated with
computer-aided detection.

[R CC synth-2D]
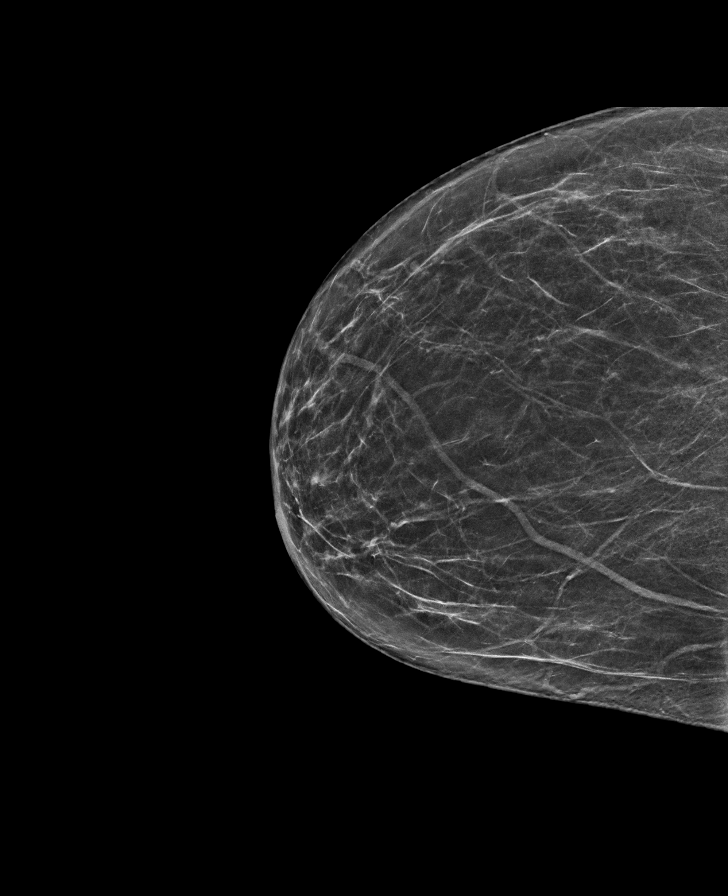

[L MLO synth-2D]
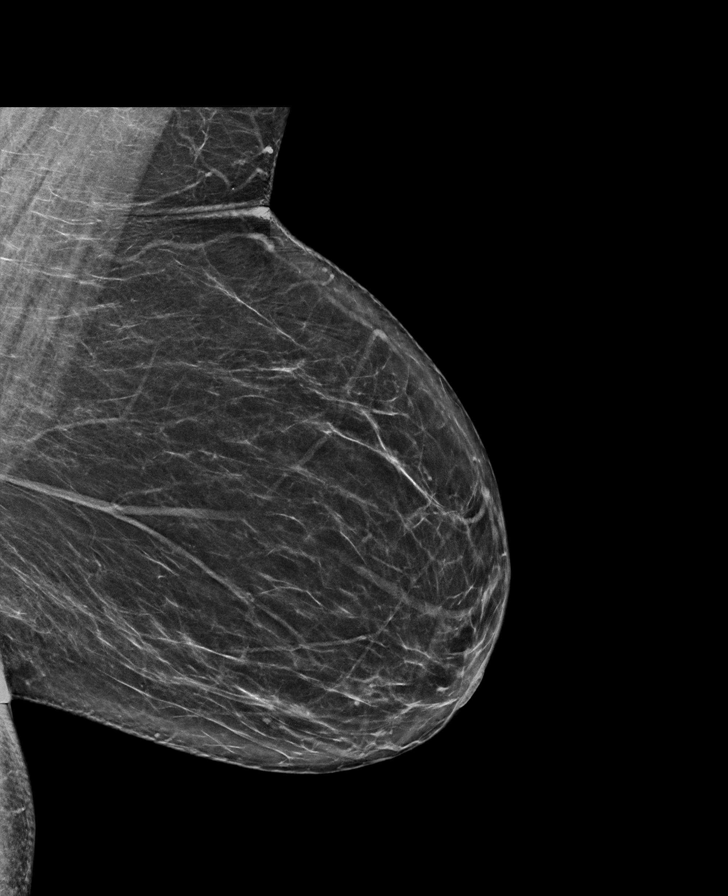

[L CC synth-2D]
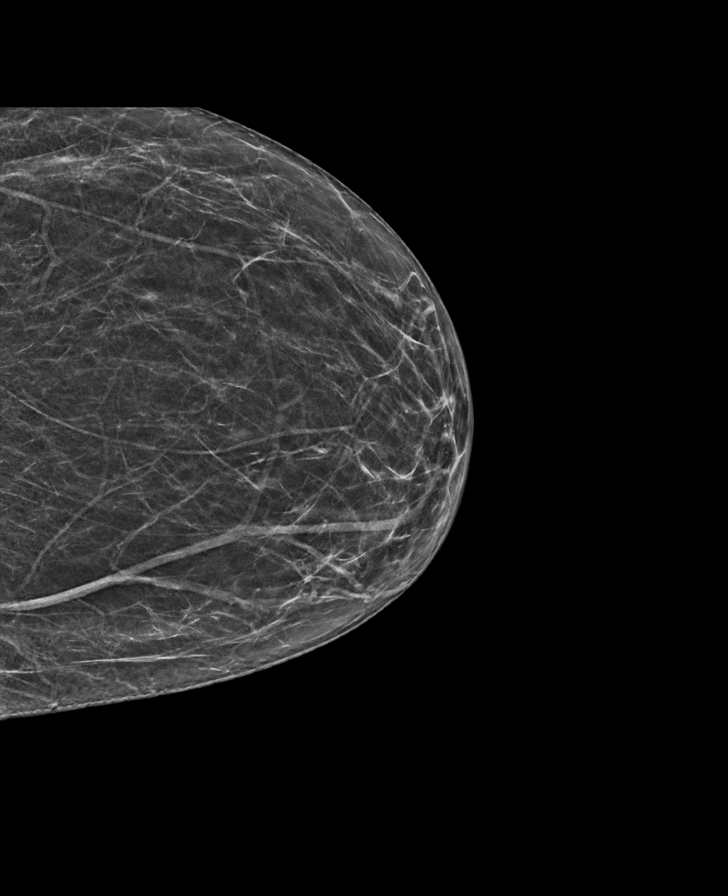

[R MLO synth-2D]
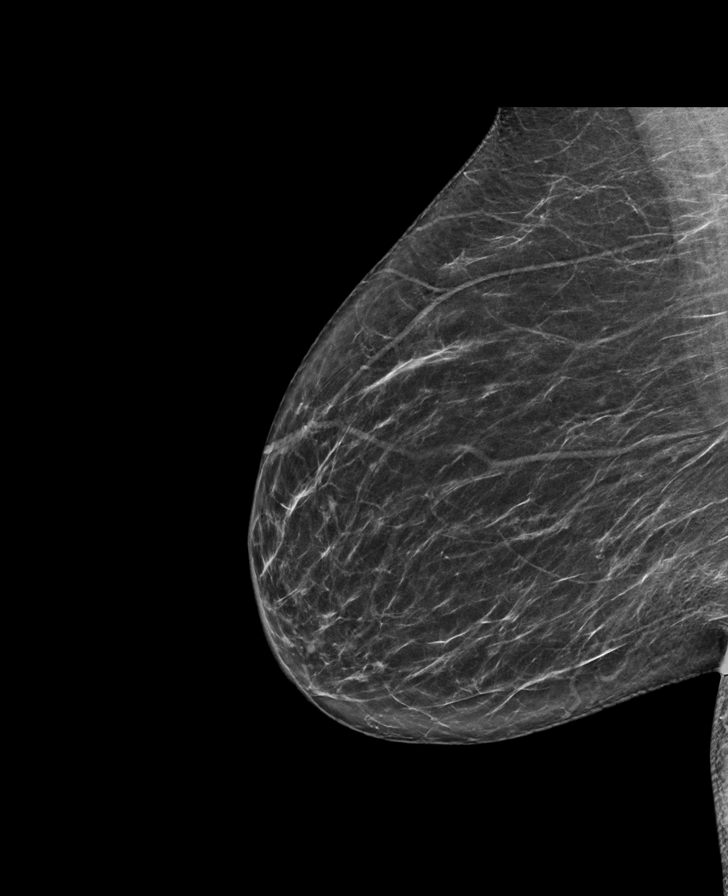

[R CC tomo · tomo slice 31/61.0]
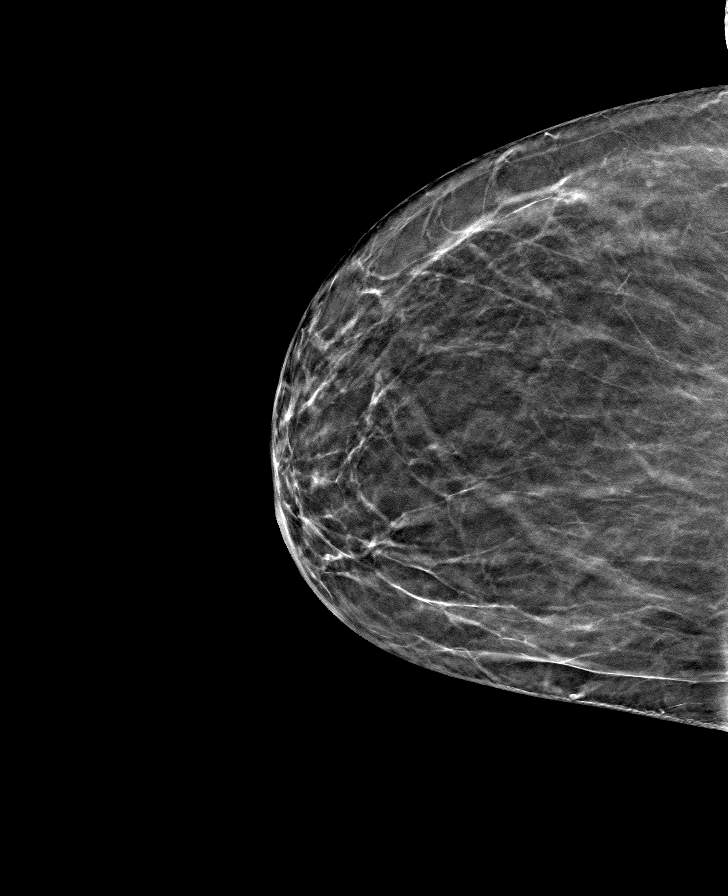

[R MLO tomo · tomo slice 35/68.0]
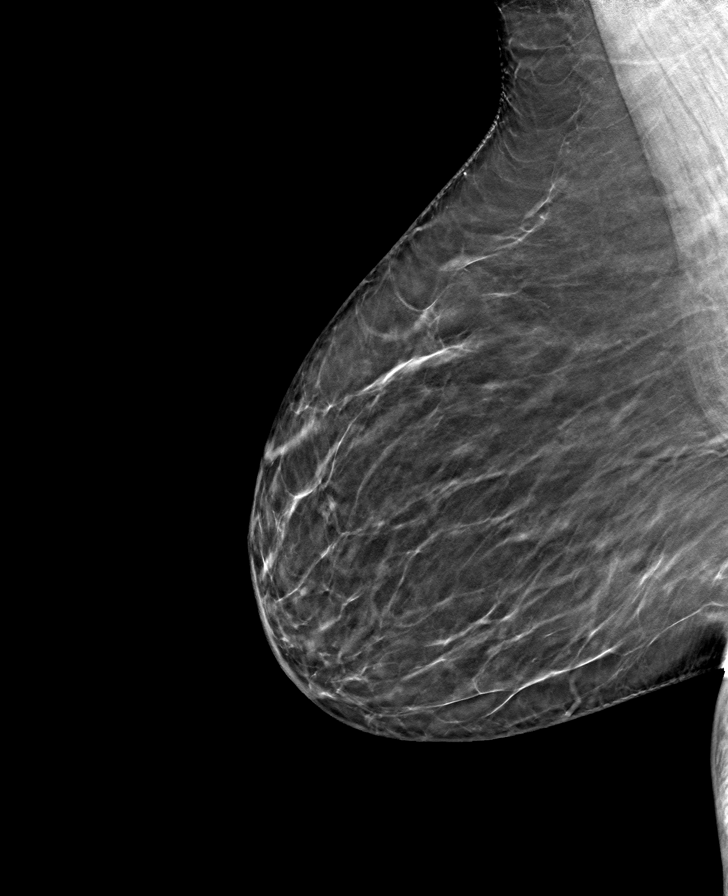

[L MLO tomo · tomo slice 35/70.0]
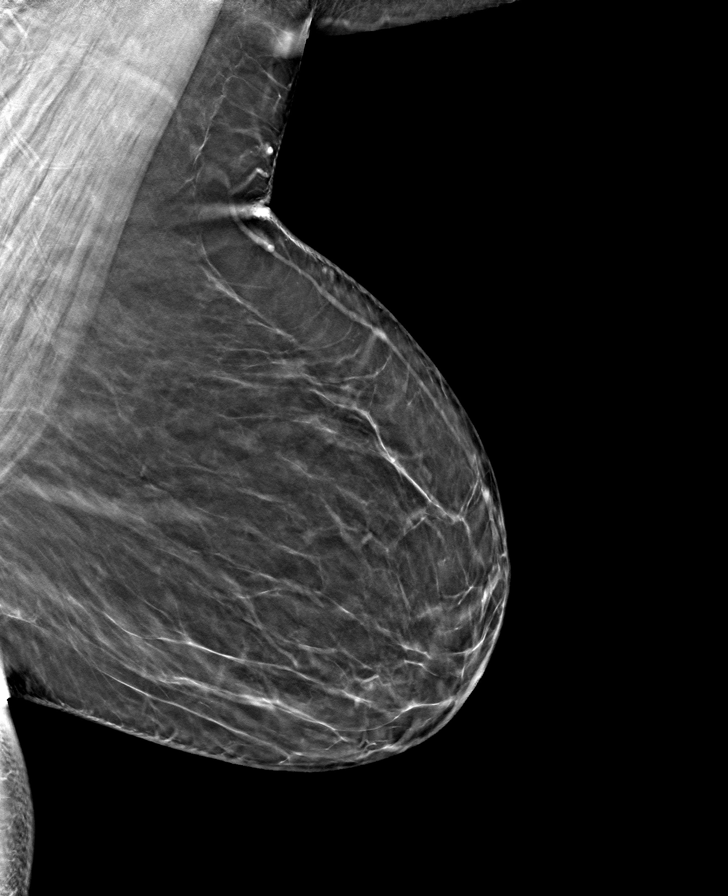

[L CC tomo · tomo slice 29/56.0]
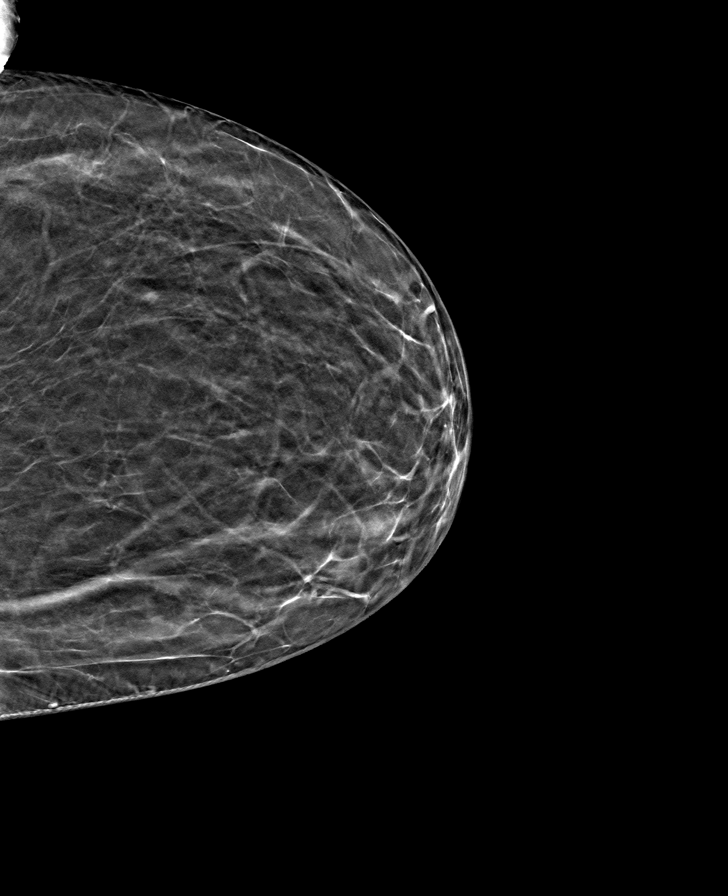

[8 of 24 positions shown; findings below may reference images not displayed]

ACR Breast Density Category b: There are scattered areas of
fibroglandular density.
FINDINGS: There are no findings suspicious for malignancy.
IMPRESSION: No mammographic evidence of malignancy. A result letter of this
screening mammogram will be mailed directly to the patient.

RECOMMENDATION:
Screening mammogram in one year. (Code:51-O-LD2)

BI-RADS CATEGORY  1: Negative.
